# Patient Record
Sex: Male | Born: 1985 | Race: White | Hispanic: No | Marital: Single | Smoking: Never smoker
Health system: Southern US, Community
[De-identification: ages and names within clinical notes are randomized; demographics above are authoritative.]

---

## 2020-08-12 ENCOUNTER — Encounter (HOSPITAL_COMMUNITY)
Admission: EM | Disposition: A | Discharge status: Home / Self Care | Payer: Self-pay | Source: Home / Self Care | Attending: Neurology

## 2020-08-12 ENCOUNTER — Emergency Department (HOSPITAL_COMMUNITY): Payer: Self-pay

## 2020-08-12 ENCOUNTER — Inpatient Hospital Stay (HOSPITAL_COMMUNITY)
Admission: EM | Admit: 2020-08-12 | Discharge: 2020-08-16 | DRG: 062 | Disposition: A | Discharge status: Home / Self Care | Payer: Self-pay | Attending: Neurology | Admitting: Neurology

## 2020-08-12 ENCOUNTER — Encounter (HOSPITAL_COMMUNITY): Payer: Self-pay | Admitting: Emergency Medicine

## 2020-08-12 DIAGNOSIS — E669 Obesity, unspecified: Secondary | ICD-10-CM | POA: Diagnosis present

## 2020-08-12 DIAGNOSIS — Z953 Presence of xenogenic heart valve: Secondary | ICD-10-CM

## 2020-08-12 DIAGNOSIS — I63511 Cerebral infarction due to unspecified occlusion or stenosis of right middle cerebral artery: Secondary | ICD-10-CM | POA: Diagnosis present

## 2020-08-12 DIAGNOSIS — G8194 Hemiplegia, unspecified affecting left nondominant side: Secondary | ICD-10-CM | POA: Diagnosis present

## 2020-08-12 DIAGNOSIS — R29703 NIHSS score 3: Secondary | ICD-10-CM | POA: Diagnosis present

## 2020-08-12 DIAGNOSIS — E785 Hyperlipidemia, unspecified: Secondary | ICD-10-CM | POA: Diagnosis present

## 2020-08-12 DIAGNOSIS — Z8249 Family history of ischemic heart disease and other diseases of the circulatory system: Secondary | ICD-10-CM

## 2020-08-12 DIAGNOSIS — R7401 Elevation of levels of liver transaminase levels: Secondary | ICD-10-CM | POA: Diagnosis present

## 2020-08-12 DIAGNOSIS — I63411 Cerebral infarction due to embolism of right middle cerebral artery: Principal | ICD-10-CM | POA: Diagnosis present

## 2020-08-12 DIAGNOSIS — Z6833 Body mass index (BMI) 33.0-33.9, adult: Secondary | ICD-10-CM

## 2020-08-12 DIAGNOSIS — I639 Cerebral infarction, unspecified: Secondary | ICD-10-CM

## 2020-08-12 DIAGNOSIS — Z880 Allergy status to penicillin: Secondary | ICD-10-CM

## 2020-08-12 DIAGNOSIS — Z20822 Contact with and (suspected) exposure to covid-19: Secondary | ICD-10-CM | POA: Diagnosis present

## 2020-08-12 DIAGNOSIS — Q230 Congenital stenosis of aortic valve: Secondary | ICD-10-CM

## 2020-08-12 DIAGNOSIS — Z833 Family history of diabetes mellitus: Secondary | ICD-10-CM

## 2020-08-12 DIAGNOSIS — R29810 Facial weakness: Secondary | ICD-10-CM | POA: Diagnosis present

## 2020-08-12 DIAGNOSIS — R4781 Slurred speech: Secondary | ICD-10-CM | POA: Diagnosis present

## 2020-08-12 DIAGNOSIS — R7989 Other specified abnormal findings of blood chemistry: Secondary | ICD-10-CM | POA: Diagnosis present

## 2020-08-12 DIAGNOSIS — E876 Hypokalemia: Secondary | ICD-10-CM | POA: Diagnosis present

## 2020-08-12 DIAGNOSIS — Z7982 Long term (current) use of aspirin: Secondary | ICD-10-CM

## 2020-08-12 LAB — PROTIME-INR
INR: 1.1 (ref 0.8–1.2)
Prothrombin Time: 13.8 seconds (ref 11.4–15.2)

## 2020-08-12 LAB — CBC
HCT: 42.5 % (ref 39.0–52.0)
Hemoglobin: 14.9 g/dL (ref 13.0–17.0)
MCH: 31 pg (ref 26.0–34.0)
MCHC: 35.1 g/dL (ref 30.0–36.0)
MCV: 88.5 fL (ref 80.0–100.0)
Platelets: 167 10*3/uL (ref 150–400)
RBC: 4.8 MIL/uL (ref 4.22–5.81)
RDW: 11.9 % (ref 11.5–15.5)
WBC: 5.9 10*3/uL (ref 4.0–10.5)
nRBC: 0 % (ref 0.0–0.2)

## 2020-08-12 LAB — DIFFERENTIAL
Abs Immature Granulocytes: 0.01 10*3/uL (ref 0.00–0.07)
Basophils Absolute: 0 10*3/uL (ref 0.0–0.1)
Basophils Relative: 1 %
Eosinophils Absolute: 0 10*3/uL (ref 0.0–0.5)
Eosinophils Relative: 0 %
Immature Granulocytes: 0 %
Lymphocytes Relative: 24 %
Lymphs Abs: 1.4 10*3/uL (ref 0.7–4.0)
Monocytes Absolute: 0.4 10*3/uL (ref 0.1–1.0)
Monocytes Relative: 7 %
Neutro Abs: 4 10*3/uL (ref 1.7–7.7)
Neutrophils Relative %: 68 %

## 2020-08-12 LAB — COMPREHENSIVE METABOLIC PANEL
ALT: 65 U/L — ABNORMAL HIGH (ref 0–44)
AST: 31 U/L (ref 15–41)
Albumin: 3.9 g/dL (ref 3.5–5.0)
Alkaline Phosphatase: 43 U/L (ref 38–126)
Anion gap: 9 (ref 5–15)
BUN: 12 mg/dL (ref 6–20)
CO2: 26 mmol/L (ref 22–32)
Calcium: 8.7 mg/dL — ABNORMAL LOW (ref 8.9–10.3)
Chloride: 103 mmol/L (ref 98–111)
Creatinine, Ser: 1.17 mg/dL (ref 0.61–1.24)
GFR, Estimated: >60 mL/min (ref >60)
Glucose, Bld: 84 mg/dL (ref 70–99)
Potassium: 3.4 mmol/L — ABNORMAL LOW (ref 3.5–5.1)
Sodium: 138 mmol/L (ref 135–145)
Total Bilirubin: 1 mg/dL (ref 0.3–1.2)
Total Protein: 6.4 g/dL — ABNORMAL LOW (ref 6.5–8.1)

## 2020-08-12 LAB — I-STAT CHEM 8, ED
BUN: 11 mg/dL (ref 6–20)
Calcium, Ion: 1.23 mmol/L (ref 1.15–1.40)
Chloride: 102 mmol/L (ref 98–111)
Creatinine, Ser: 1.1 mg/dL (ref 0.61–1.24)
Glucose, Bld: 77 mg/dL (ref 70–99)
HCT: 41 % (ref 39.0–52.0)
Hemoglobin: 13.9 g/dL (ref 13.0–17.0)
Potassium: 3.5 mmol/L (ref 3.5–5.1)
Sodium: 141 mmol/L (ref 135–145)
TCO2: 26 mmol/L (ref 22–32)

## 2020-08-12 LAB — APTT: aPTT: 29 seconds (ref 24–36)

## 2020-08-12 LAB — RESP PANEL BY RT-PCR (FLU A&B, COVID) ARPGX2
Influenza A by PCR: NEGATIVE
Influenza B by PCR: NEGATIVE
SARS Coronavirus 2 by RT PCR: NEGATIVE

## 2020-08-12 LAB — CBG MONITORING, ED: Glucose-Capillary: 87 mg/dL (ref 70–99)

## 2020-08-12 LAB — ETHANOL: Alcohol, Ethyl (B): <10 mg/dL (ref <10)

## 2020-08-12 SURGERY — IR WITH ANESTHESIA
Anesthesia: General

## 2020-08-12 MED ORDER — SODIUM CHLORIDE 0.9 % IV SOLN: 50.0000 mL | Freq: Once | INTRAVENOUS | Status: AC

## 2020-08-12 MED ORDER — IOHEXOL 350 MG/ML SOLN
75.0000 mL | Freq: Once | INTRAVENOUS | Status: AC | PRN
  Administered 2020-08-12: 75 mL via INTRAVENOUS

## 2020-08-12 MED ORDER — ALTEPLASE (STROKE) FULL DOSE INFUSION
90.0000 mg | Freq: Once | INTRAVENOUS | Status: DC
  Filled 2020-08-12: qty 100

## 2020-08-12 MED ORDER — ALTEPLASE (STROKE) FULL DOSE INFUSION
90.0000 mg | Freq: Once | INTRAVENOUS | Status: AC
  Administered 2020-08-12: 90 mg via INTRAVENOUS
  Filled 2020-08-12: qty 90

## 2020-08-12 MED ORDER — IOHEXOL 350 MG/ML SOLN
50.0000 mL | Freq: Once | INTRAVENOUS | Status: AC | PRN
  Administered 2020-08-12: 50 mL via INTRAVENOUS

## 2020-08-12 MED ORDER — SODIUM CHLORIDE 0.9 % IV SOLN: 50.0000 mL | Freq: Once | INTRAVENOUS | Status: DC

## 2020-08-12 NOTE — Consult Note (Signed)
[facility] TeleNeurology Consult Services   Date of Service:   08/12/2020 20:12:23  Diagnosis:     .  I63.00 - Cerebrovascular accident (CVA) due to thrombosis of precerebral artery (HCCC)  Impression: 34 yr old man, hx of AVR in 2015, not on any meds, with sudden onset L sided face, arm, leg numbness, slurred speech- NIH 2.  Speech improved. Numbness is complete, he cannot feel L face, arm, leg.   CT head no acute changes.   Diff Dx: Acute CVA, other, he is candidate for IV thrombolytics, risks, benefits d/w him, he has no absolute contraindications. Additional testing came back as I was discussing alteplase, and CTA shows R M2 proximal occlusion, acute, per radiology, and patient agrees to proceed with alteplase, and I also d/w ER Dr Pilar Plate. Pt to be transferred to [facility] after starting alteplase infusion, per discussion between Dr Pilar Plate and Pat[facility] Neurology/ NIR.   Rec:   - Weight based alteplase administered, bolus in no complications, infusion going  - ICU care, and post alteplase protocol  - NIR evaluation for R M2 occlusion, patient is being transferred to [facility].  - Possible thrombectomy candidate post CT perfusion which is planned to be done at [facility] per ER .  - MRI Brain  - CVA work up  - cardiac work up, r/o embolic source.  - Neurology follow up   d/w ER all of the above recs.  Metrics: Last Known Well: 08/12/2020 19:15:00 [facility] Notification Time: 08/12/2020 66:29:47 Arrival Time: 08/12/2020 19:44:00 Stamp Time: 08/12/2020 65:46:50 Time First Login Attempt: 08/12/2020 20:18:29 Symptoms: L numbness, weakness, slurred speech. NIHSS Start Assessment Time: 08/12/2020 20:23:30 Thrombolytic Early Mix Decision Time: 08/12/2020 20:30:48 Patient is a candidate for Thrombolytic. Thrombolytic Medical Decision: 08/12/2020 20:34:15 Needle Time: 08/12/2020 20:59:41 Weight Noted by Staff: 241.5 lbs Reason for Thrombolytic Delay: Delayed  hospital activation of TS Service, Delays related to prolonged mixing and delivery of Thrombolytics ,  CT head showed no acute hemorrhage or acute core infarct.  ED Physician notified of diagnostic impression and management plan on 08/12/2020 20:55:27  Advanced Imaging: CTA Head and Neck Completed.  LVO:Yes  Discussed with NIR:No  Discussed with NIR Text:  ER Dr Pilar Plate d/w [facility] and patient to be transferred to [facility] Neurology / NIR. This is per protocol, and I am available if needed. D/w DR Pilar Plate ER.   Thrombolytic Contraindications:  Last Known Well > 4.5 hours: No CT Head showing hemorrhage: No Ischemic stroke within 3 months: No Severe head trauma within 3 months: No Intracranial/intraspinal surgery within 3 months: No History of intracranial hemorrhage: No Symptoms and signs consistent with an SAH: No GI malignancy or GI bleed within 21 days: No Coagulopathy: Platelets <100 000/mm3, INR >1.7, aPTT>40 s, or PT >15 s: No Treatment dose of LMWH within the previous 24 hrs: No Use of NOACs in past 48 hours: No Glycoprotein IIb/IIIa receptor inhibitors use: No Symptoms consistent with infective endocarditis: No Suspected aortic arch dissection: No Intra-axial intracranial neoplasm: No  Thrombolytic Decision and Management Plan: Management with thrombolytic treatment was explained to the Patient as was risks and benefits and alternatives to the treatment. Patient agrees with the decision to proceed with thrombolytic treatment. . All questions were answered and the Patient expressed understanding of the treatment plan.  Our recommendations are outlined below.  Recommendations: IV Alteplase recommended.  Thrombolytic bolus given Without Complication.   IV Alteplase/Activase Total Dose - 90.0 mg IV Alteplase/Activase Bolus Dose -  9.0 mg IV Alteplase/Activase Infusion Dose - 81.0 mg   Routine post Thrombolytic monitoring including neuro checks and blood  pressure control during/after treatment Monitor blood pressure Check blood pressure and neuro assessment every 15 min for 2 h, then every 30 min for 6 h, and finally every hour for 16 h.  Manage Blood Pressure per post Thrombolytic protocol.      .  Admission to ICU     .  CT brain 24 hours post Thrombolytic     .  NPO until swallowing screen performed and passed     .  No antiplatelet agents or anticoagulants (including heparin for DVT prophylaxis) in first 24 hours     .  No Foley catheter, nasogastric tube, arterial catheter or central venous catheter for 24 hr, unless absolutely necessary     .  Telemetry     .  Bedside swallow evaluation     .  HOB less than 30 degrees     .  Euglycemia     .  Avoid hyperthermia, PRN acetaminophen     .  DVT prophylaxis     .  Inpatient Neurology Consultation     .  Stroke evaluation as per inpatient neurology recommendations  Discussed with ED physician    ------------------------------------------------------------------------------  History of Present Illness: Patient is a 34 year old Male.  Patient was brought by private transportation with symptoms of L numbness, weakness, slurred speech.  34 yr old man, hx of Aortic valve replacement, not on any meds at home, reports at 1915 today, he had sudden onset L arm, leg numbness, weakness L hand, he dropped his phone and could not grip it, he came to ER. He has no weakness, he has persistent significant numbness L face, arm, leg, he initially had aphasia/dysarthria, now improved. He denied any head trauma, no CI for IV thrombolytics which was discussed.  Last seen normal was within 4.5 hours. There is no history of hemorrhagic complications or intracranial hemorrhage. There is no history of Recent Anticoagulants. There is no history of recent major surgery. There is no history of recent stroke.  Past Medical History:     . Aortic valve replacement  Social History: Smoking: No Alcohol Use:  No Drug Use: No  Family History:cad  Review of System:  14 Points Review of Systems was performed and was negative except mentioned in HPI.  Anticoagulant use:  No  Antiplatelet use: No  Allergies:  Reviewed   Examination: BP(143/93, 129/80), Pulse(88, 79), Blood Glucose(77) 1A: Level of Consciousness - Alert; keenly responsive + 0 1B: Ask Month and Age - Both Questions Right + 0 1C: Blink Eyes & Squeeze Hands - Performs Both Tasks + 0 2: Test Horizontal Extraocular Movements - Normal + 0 3: Test Visual Fields - No Visual Loss + 0 4: Test Facial Palsy (Use Grimace if Obtunded) - Normal symmetry + 0 5A: Test Left Arm Motor Drift - No Drift for 10 Seconds + 0 5B: Test Right Arm Motor Drift - No Drift for 10 Seconds + 0 6A: Test Left Leg Motor Drift - No Drift for 5 Seconds + 0 6B: Test Right Leg Motor Drift - No Drift for 5 Seconds + 0 7: Test Limb Ataxia (FNF/Heel-Shin) - No Ataxia + 0 8: Test Sensation - Complete Loss: Cannot Sense Being Touched At All + 2 9: Test Language/Aphasia - Normal; No aphasia + 0 10: Test Dysarthria - Normal + 0 11: Test Extinction/Inattention - No  abnormality + 0  NIHSS Score: 2  Pre-Morbid Modified Rankin Scale: 0 Points = No symptoms at all   Patient/Family was informed the Neurology Consult would occur via TeleHealth consult by way of interactive audio and video telecommunications and consented to receiving care in this manner.   Patient is being evaluated for possible acute neurologic impairment and high probability of imminent or life-threatening deterioration. I spent total of 45 minutes providing care to this patient, including time for face to face visit via telemedicine, review of medical records, imaging studies and discussion of findings with providers, the patient and/or family.   Dr Rubye Oaks   [facility] [phone]  Case [id number]

## 2020-08-12 NOTE — ED Provider Notes (Signed)
[facility]-EMERGENCY DEPT [facility] Emergency Department Provider Note MRN:  [id number]  Arrival date & time: 08/12/20     Chief Complaint   Code Stroke   History of Present Illness   Anthony Buckley is a 34 y.o. year-old male with no pertinent past medical history presenting to the ED with chief complaint of code stroke.  Patient experienced sudden onset left-sided numbness at 7:15 PM, 30 minutes prior to arrival in the ED.  Denies any weakness, no speech disturbance, no visual disturbance, this is never happened before.  No chest pain or shortness of breath, no abdominal pain, no neck pain, no other complaints.  Symptoms constant since onset.  Review of Systems  A complete 10 system review of systems was obtained and all systems are negative except as noted in the HPI and PMH.   Patient's Health History   History reviewed. No pertinent past medical history.  History reviewed. No pertinent surgical history.  No family history on file.  Social History   Socioeconomic History  . Marital status: Single    Spouse name: Not on file  . Number of children: Not on file  . Years of education: Not on file  . Highest education level: Not on file  Occupational History  . Not on file  Tobacco Use  . Smoking status: Not on file  Substance and Sexual Activity  . Alcohol use: Not on file  . Drug use: Not on file  . Sexual activity: Not on file  Other Topics Concern  . Not on file  Social History Narrative  . Not on file   Social Determinants of Health   Financial Resource Strain:   . Difficulty of Paying Living Expenses: Not on file  Food Insecurity:   . Worried About Programme researcher, broadcasting/film/video in the Last Year: Not on file  . Ran Out of Food in the Last Year: Not on file  Transportation Needs:   . Lack of Transportation (Medical): Not on file  . Lack of Transportation (Non-Medical): Not on file  Physical Activity:   . Days of Exercise per Week: Not on file  . Minutes of Exercise per  Session: Not on file  Stress:   . Feeling of Stress : Not on file  Social Connections:   . Frequency of Communication with Friends and Family: Not on file  . Frequency of Social Gatherings with Friends and Family: Not on file  . Attends Religious Services: Not on file  . Active Member of Clubs or Organizations: Not on file  . Attends Banker Meetings: Not on file  . Marital Status: Not on file  Intimate Partner Violence:   . Fear of Current or Ex-Partner: Not on file  . Emotionally Abused: Not on file  . Physically Abused: Not on file  . Sexually Abused: Not on file     Physical Exam   Vitals:   08/12/20 1957 08/12/20 2030  BP: (!) 162/116 137/89  Pulse: 92 89  Resp: 16 10  Temp: 98.2 F (36.8 C)   SpO2: 100% 100%    CONSTITUTIONAL: Well-appearing, NAD NEURO:  Alert and oriented x 3, normal and symmetric strength, decreased sensation to the left face, left arm, left leg EYES:  eyes equal and reactive ENT/NECK:  no LAD, no JVD CARDIO: Regular rate, well-perfused, normal S1 and S2 PULM:  CTAB no wheezing or rhonchi GI/GU:  normal bowel sounds, non-distended, non-tender MSK/SPINE:  No gross deformities, no edema SKIN:  no rash, atraumatic  PSYCH:  Appropriate speech and behavior  *Additional and/or pertinent findings included in MDM below  Diagnostic and Interventional Summary    EKG Interpretation  Date/Time:  Thursday August 12 2020 20:28:32 EST Ventricular Rate:  89 PR Interval:    QRS Duration: 95 QT Interval:  373 QTC Calculation: 454 R Axis:   87 Text Interpretation: Sinus rhythm Minimal ST depression, inferior leads Confirmed by Kennis Carina [phone]) on 08/12/2020 8:47:00 PM      Labs Reviewed  CBC  DIFFERENTIAL  ETHANOL  PROTIME-INR  APTT  COMPREHENSIVE METABOLIC PANEL  RAPID URINE DRUG SCREEN, HOSP PERFORMED  URINALYSIS, ROUTINE W REFLEX MICROSCOPIC  I-STAT CHEM 8, ED    CT Angio Head W or Wo Contrast  Final Result    CT Angio  Neck W and/or Wo Contrast  Final Result    CT HEAD CODE STROKE WO CONTRAST  Final Result      Medications  alteplase (ACTIVASE) 1 mg/mL infusion 90 mg (has no administration in time range)    Followed by  0.9 %  sodium chloride infusion (has no administration in time range)  iohexol (OMNIPAQUE) 350 MG/ML injection 75 mL (75 mLs Intravenous Contrast Given 08/12/20 2016)     Procedures  /  Critical Care .Critical Care Performed by: Sabas Sous, MD Authorized by: Sabas Sous, MD   Critical care provider statement:    Critical care time (minutes):  40   Critical care was necessary to treat or prevent imminent or life-threatening deterioration of the following conditions: Acute ischemic stroke.   Critical care was time spent personally by me on the following activities:  Discussions with consultants, evaluation of patient's response to treatment, examination of patient, ordering and performing treatments and interventions, ordering and review of laboratory studies, ordering and review of radiographic studies, pulse oximetry, re-evaluation of patient's condition, obtaining history from patient or surrogate and review of old charts    ED Course and Medical Decision Making  I have reviewed the triage vital signs, the nursing notes, and pertinent available records from the EMR.  Listed above are laboratory and imaging tests that I personally ordered, reviewed, and interpreted and then considered in my medical decision making (see below for details).  Pure left-sided sensory losses onset 45 minutes ago and is otherwise healthy 34 year old male.  Code stroke initiated, awaiting imaging and telemetry neurology recommendations.     CTA reveals acute right M2 occlusion, patient being evaluated by Dr. Nelson Chimes of teleneurology.  Patient is a TPA candidate and the risks and benefits were explained and patient agrees to TPA.  Patient is also potentially an IR candidate.  I spoke with Dr. Iver Nestle  at [facility] who is now aware of the patient and will inform the IR team.  Dr. Rubin Payor is the accepting ED physician for transfer.  Patient to receive TPA and then be transported.  Elmer Sow. Pilar Plate, MD [facility] Emergency Medicine [facility] [email or web address]  Final Clinical Impressions(s) / ED Diagnoses     ICD-10-CM   1. Acute ischemic stroke [facility])  I63.9     ED Discharge Orders    None       Discharge Instructions Discussed with and Provided to Patient:   Discharge Instructions   None       Sabas Sous, MD 08/12/20 2051

## 2020-08-12 NOTE — ED Triage Notes (Addendum)
Pt c/o loss of sensation in left side of body and slurred speach that began an hour ago. States he dropped his phone and went to pick it up and couldn't grip his phone with this left hand. Hand grips present and strong bilaterally. Last known normal 1915 today. Pt assessed by MD Bero in triage.

## 2020-08-12 NOTE — Progress Notes (Signed)
PHARMACIST CODE STROKE RESPONSE  Notified to mix tPA at 20:38 by Dr. Pilar Plate Delivered tPA to RN at 20:48  tPA dose = 9 mg bolus over 1 minute followed by 81 mg for a total dose of 90 mg over 1 hour  Issues/delays encountered (if applicable):   Loralee Pacas, PharmD, BCPS Pharmacy: [phone] 08/12/20 9:11 PM

## 2020-08-12 NOTE — Code Documentation (Signed)
Stroke Response Nurse Documentation Code Documentation  Anthony Buckley is a 34 y.o. male arriving to [facility] ED via [facility] on 12/2 with past medical hx of AVR x2. Code stroke was activated by [facility] ED. Patient from home where he was LKW at [phone] and now complaining of loss of sensation LUE . On No antithrombotic. Stroke team at the bedside on patient arrival. Labs drawn and patient cleared for CT by Dr. Rubin Payor. Patient to CT with team. NIHSS 3, see documentation for details and code stroke times. Patient with left facial droop and left decreased sensation on exam. The following imaging was completed:  CTA head and neck, CTP. Patient is a candidate for tPA and initiated at [facility] ED PTA. Right MCA occlusion, Dr. Corliss Skains and Dr. Iver Nestle discussed with pt and pt wishes for more time to decide.  Bedside handoff with ED RN Eulis Foster.    Rose Fillers  Rapid Response RN

## 2020-08-12 NOTE — ED Notes (Signed)
TPA started at 2059.

## 2020-08-12 NOTE — ED Provider Notes (Signed)
  Physical Exam  BP 129/80   Pulse 82   Temp 98.2 F (36.8 C) (Oral)   Resp 12   Ht 5\' 11"  (1.803 m)   Wt 109.5 kg   SpO2 98%   BMI 33.68 kg/m   Physical Exam  ED Course/Procedures     Procedures  MDM  Transfer from [facility].  Acute stroke.  To get CT perfusion and admit to ICU by neurology.  Patient states so far no improvement with the TPA.       [facility], MD 08/12/20 2204

## 2020-08-12 NOTE — H&P (Addendum)
Neurology H&P  CC: Left-sided numbness and left facial droop, transient left hand weakness  History is obtained from: Patient and brother at bedside  HPI: Anthony Buckley is a 34 y.o. male with a PMHx of congenital aortic stenosis and regurgitation status post aortic valve replacement x2 (at age 7 and then again in 7169) complicated by aortic dilation status post graft (2015 with valve replacement). He reports he was recommended to take aspirin for 6 weeks after his last surgery in 2015. Otherwise he has not been on any medications. He is to follow with Dr. Festus Holts at [facility] clinic (last seen in 2016 due to lapse in insurance).  He was in his usual state of health today when at 7:15 PM he suddenly had difficulty with his left side, dropping his phone and feeling numb. He presented to [facility] long hospital and was evaluated by teleneurology  On review of systems, he denies any other transient neurological symptoms but then his brother reports that he did have 1 transient episode of left hand numbness a few weeks ago lasting a few seconds (chalked up to being outside in the cold, under dressed for the weather). He additionally reports having lost 30 pounds since June 1, most of this over the early summer months and stable for the past 3 months, intentionally lost with lifestyle changes (diet and exercise).  He additionally notes that he has had some difficult experiences with anesthesia (unstable heart rates for his first 2 surgeries, uncomplicated most recent procedure)  LKW: 7:15 PM on 08/14/19/2021 tPA given?: Yes, started at [facility] long hospital at 20:59 IA performed?: No,  Premorbid modified rankin scale:      0 - No symptoms.  ROS: A 14 point ROS was performed and is negative except as noted in the HPI.    Pertinent medical history and surgical history as noted above  Family history: Maternal grandfather with cancer in his 34s Paternal aunt and uncle with diabetes Concern for  alcohol abuse on both sides of the family  Social History: He reports he is currently unemployed. 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 He reports he has never smoked or used any illegal substances. He drinks twice a month ~2 beers   Exam: Current vital signs: BP (!) 152/89   Pulse 84   Temp 98.2 F (36.8 C) (Oral)   Resp 14   Ht 5' 11" (1.803 m)   Wt 109.5 kg   SpO2 100%   BMI 33.68 kg/m  Vital signs in last 24 hours: Temp:  [98.2 F (36.8 C)] 98.2 F (36.8 C) (12/02 1957) Pulse Rate:  [82-92] 84 (12/02 2200) Resp:  [10-16] 14 (12/02 2200) BP: (129-162)/(80-116) 152/89 (12/02 2200) SpO2:  [97 %-100 %] 100 % (12/02 2200) Weight:  [108.9 kg-109.5 kg] 109.5 kg (12/02 2033)   Physical Exam  Constitutional: Appears well-developed and well-nourished.  Psych: Affect appropriate to situation Eyes: No scleral injection HENT: No OP obstruction, good dentition MSK: no joint deformities.  Cardiovascular: Perfusing extremities well, regular rate Respiratory: Effort normal, non-labored breathing GI: Soft.  No distension. There is no tenderness.  Skin: WDI, well-healed sternotomy scar  Neuro: Mental Status: Patient is awake, alert, oriented to person, place, month, year, and situation. Patient is able to give a clear and coherent history. No signs of aphasia or neglect Cranial Nerves: II: Visual Fields are full. Pupils are equal, round, and reactive to light.   III,IV, VI: EOMI without ptosis or  diploplia.  V: Facial sensation is notable for reduced sensation on the left side of the face VII: Facial movement is notable for mild facial droop, improved after TPA administration VIII: hearing is intact to voice X: Uvula elevates symmetrically XI: Shoulder shrug is symmetric. XII: tongue is midline without atrophy or fasciculations.  Motor: Tone is normal. Bulk is normal. 5/5 strength was present in all four extremities.  Sensory: Initially  had severe sensory loss on the left side, after TPA this was improving starting at about 11:30 PM Deep Tendon Reflexes: 2+ and symmetric in the biceps and patellae, brisk Cerebellar: FNF and HKS are intact bilaterally  NIHSS total 3 Score breakdown: 1 for facial droop, 2 for sensory loss  At 10:30 PM NIH stroke scale remained a 3, identical to prior  By 1 AM, NIH stroke scale had improved to 2 (improving but still subtle left facial droop, improving ability to feel on the left side)  I have reviewed labs in epic and the results pertinent to this consultation are: COVID-19 negative,  Creatinine 1.17, GFR greater than 60 CMP with mildly elevated ALT (65) Normal CBC  I have reviewed the images obtained: HCT with hyperdense vessel CTA with R M2 occlusion CTP with 60 mL tissue at risk, core corresponding to patients symptoms but possibly ghost core due to less than 6 hours since onset    Impression: This is a 34 year old gentleman with past medical history significant for aortic valve replacement and obesity (BMI 33.68), presenting with a right MCA likely cardioembolic stroke.  There is an extended discussion about intervention risks and benefits given low NIH stroke scale but proximal occlusion with large area at risk.  Ultimately a conservative approach with close observation was selected by the patient.  Given proximal occlusion despite low NIH stroke scale, patient was not felt to be an OPTIMIST trial candidate and every hour neuro checks in the ICU are indicated in case patient declines at which point thrombectomy would be pursued if head CT remains negative for hemorrhage  Recommendations:  # R MCA M2 stroke s/p tPA - Stroke labs TSH, ESR, RPR, HgbA1c, fasting lipid panel - Outpatient hypercoagulable panel given patient received tPA - MRI brain  - MRA of the brain without contrast and MRA neck w/wo or CTA  - Frequent neuro checks (every hour for the next 8 hours) -  Echocardiogram - Prophylactic therapy-Antiplatelet med: Aspirin - dose 34m PO or 3064mPR, followed by 81 mg daily  - Statin if indicated for LDL goal <  70 - Risk factor modification (diet/exercise) - Telemetry monitoring; 30 day event monitor on discharge if no arrythmias captured  - Blood pressure goal   - Post tPA for 24  hours < 180/105 - PT consult, OT consult, Speech consult, unless patient is back to baseline - Please page neurology emergently for any decline in exam, reactivate code stroke for potential IR - Stroke team to follow  SrLesleigh NoeD-PhD [facility] [phone] minutes of critical care time were spent with this patient, including discussions with IR, emergency department, patient and family, over 50% at bedside due to potential life-threatening stroke.

## 2020-08-13 ENCOUNTER — Inpatient Hospital Stay (HOSPITAL_COMMUNITY): Payer: Self-pay

## 2020-08-13 DIAGNOSIS — I63511 Cerebral infarction due to unspecified occlusion or stenosis of right middle cerebral artery: Secondary | ICD-10-CM | POA: Diagnosis present

## 2020-08-13 DIAGNOSIS — I361 Nonrheumatic tricuspid (valve) insufficiency: Secondary | ICD-10-CM

## 2020-08-13 DIAGNOSIS — I639 Cerebral infarction, unspecified: Secondary | ICD-10-CM

## 2020-08-13 DIAGNOSIS — R7989 Other specified abnormal findings of blood chemistry: Secondary | ICD-10-CM

## 2020-08-13 DIAGNOSIS — E7800 Pure hypercholesterolemia, unspecified: Secondary | ICD-10-CM

## 2020-08-13 LAB — LIPID PANEL
Cholesterol: 189 mg/dL (ref 0–200)
HDL: 25 mg/dL — ABNORMAL LOW (ref >40)
LDL Cholesterol: 144 mg/dL — ABNORMAL HIGH (ref 0–99)
Total CHOL/HDL Ratio: 7.6 RATIO
Triglycerides: 101 mg/dL (ref <150)
VLDL: 20 mg/dL (ref 0–40)

## 2020-08-13 LAB — HEMOGLOBIN A1C
Hgb A1c MFr Bld: 4.6 % — ABNORMAL LOW (ref 4.8–5.6)
Mean Plasma Glucose: 85.32 mg/dL

## 2020-08-13 LAB — COMPREHENSIVE METABOLIC PANEL
ALT: 59 U/L — ABNORMAL HIGH (ref 0–44)
AST: 25 U/L (ref 15–41)
Albumin: 3.8 g/dL (ref 3.5–5.0)
Alkaline Phosphatase: 44 U/L (ref 38–126)
Anion gap: 11 (ref 5–15)
BUN: 12 mg/dL (ref 6–20)
CO2: 24 mmol/L (ref 22–32)
Calcium: 9.2 mg/dL (ref 8.9–10.3)
Chloride: 105 mmol/L (ref 98–111)
Creatinine, Ser: 1.06 mg/dL (ref 0.61–1.24)
GFR, Estimated: >60 mL/min (ref >60)
Glucose, Bld: 87 mg/dL (ref 70–99)
Potassium: 3.3 mmol/L — ABNORMAL LOW (ref 3.5–5.1)
Sodium: 140 mmol/L (ref 135–145)
Total Bilirubin: 0.8 mg/dL (ref 0.3–1.2)
Total Protein: 6.1 g/dL — ABNORMAL LOW (ref 6.5–8.1)

## 2020-08-13 LAB — ECHOCARDIOGRAM COMPLETE
AR max vel: 2.14 cm2
AV Area VTI: 2.23 cm2
AV Area mean vel: 2.04 cm2
AV Mean grad: 11.3 mmHg
AV Peak grad: 20.7 mmHg
Ao pk vel: 2.27 m/s
Area-P 1/2: 3.17 cm2
Height: 71 in
P 1/2 time: 639 msec
S' Lateral: 3.1 cm
Weight: 3864 oz

## 2020-08-13 LAB — CBC WITH DIFFERENTIAL/PLATELET
Abs Immature Granulocytes: 0.02 10*3/uL (ref 0.00–0.07)
Basophils Absolute: 0 10*3/uL (ref 0.0–0.1)
Basophils Relative: 0 %
Eosinophils Absolute: 0 10*3/uL (ref 0.0–0.5)
Eosinophils Relative: 1 %
HCT: 41.2 % (ref 39.0–52.0)
Hemoglobin: 14.9 g/dL (ref 13.0–17.0)
Immature Granulocytes: 0 %
Lymphocytes Relative: 33 %
Lymphs Abs: 2.3 10*3/uL (ref 0.7–4.0)
MCH: 31 pg (ref 26.0–34.0)
MCHC: 36.2 g/dL — ABNORMAL HIGH (ref 30.0–36.0)
MCV: 85.8 fL (ref 80.0–100.0)
Monocytes Absolute: 0.6 10*3/uL (ref 0.1–1.0)
Monocytes Relative: 9 %
Neutro Abs: 4 10*3/uL (ref 1.7–7.7)
Neutrophils Relative %: 57 %
Platelets: 169 10*3/uL (ref 150–400)
RBC: 4.8 MIL/uL (ref 4.22–5.81)
RDW: 11.9 % (ref 11.5–15.5)
WBC: 7 10*3/uL (ref 4.0–10.5)
nRBC: 0 % (ref 0.0–0.2)

## 2020-08-13 LAB — RAPID URINE DRUG SCREEN, HOSP PERFORMED
Amphetamines: NOT DETECTED
Barbiturates: NOT DETECTED
Benzodiazepines: NOT DETECTED
Cocaine: NOT DETECTED
Opiates: NOT DETECTED
Tetrahydrocannabinol: NOT DETECTED

## 2020-08-13 LAB — MRSA PCR SCREENING: MRSA by PCR: NEGATIVE

## 2020-08-13 LAB — C-REACTIVE PROTEIN: CRP: 0.7 mg/dL (ref <1.0)

## 2020-08-13 LAB — RPR: RPR Ser Ql: NONREACTIVE

## 2020-08-13 LAB — HIV ANTIBODY (ROUTINE TESTING W REFLEX): HIV Screen 4th Generation wRfx: NONREACTIVE

## 2020-08-13 LAB — TSH: TSH: 9.549 u[IU]/mL — ABNORMAL HIGH (ref 0.350–4.500)

## 2020-08-13 LAB — SEDIMENTATION RATE: Sed Rate: 1 mm/hr (ref 0–16)

## 2020-08-13 MED ORDER — ATORVASTATIN CALCIUM 40 MG PO TABS
40.0000 mg | ORAL_TABLET | Freq: Every day | ORAL | Status: DC
  Administered 2020-08-13 – 2020-08-16 (×4): 40 mg via ORAL
  Filled 2020-08-13 (×4): qty 1

## 2020-08-13 MED ORDER — ACETAMINOPHEN 325 MG PO TABS: 650.0000 mg | ORAL_TABLET | ORAL | Status: DC | PRN

## 2020-08-13 MED ORDER — SODIUM CHLORIDE 0.9 % IV SOLN: INTRAVENOUS | Status: DC

## 2020-08-13 MED ORDER — ASPIRIN EC 325 MG PO TBEC
325.0000 mg | DELAYED_RELEASE_TABLET | Freq: Every day | ORAL | Status: DC
  Administered 2020-08-13: 325 mg via ORAL
  Filled 2020-08-13: qty 1

## 2020-08-13 MED ORDER — CHLORHEXIDINE GLUCONATE CLOTH 2 % EX PADS
6.0000 | MEDICATED_PAD | Freq: Every day | CUTANEOUS | Status: DC
  Administered 2020-08-14: 6 via TOPICAL

## 2020-08-13 MED ORDER — POTASSIUM CHLORIDE CRYS ER 20 MEQ PO TBCR
40.0000 meq | EXTENDED_RELEASE_TABLET | ORAL | Status: AC
  Administered 2020-08-13 (×2): 40 meq via ORAL
  Filled 2020-08-13 (×2): qty 2

## 2020-08-13 MED ORDER — SENNOSIDES-DOCUSATE SODIUM 8.6-50 MG PO TABS: 1.0000 | ORAL_TABLET | Freq: Every evening | ORAL | Status: DC | PRN

## 2020-08-13 MED ORDER — ACETAMINOPHEN 650 MG RE SUPP: 650.0000 mg | RECTAL | Status: DC | PRN

## 2020-08-13 MED ORDER — STROKE: EARLY STAGES OF RECOVERY BOOK
Freq: Once | Status: AC
  Filled 2020-08-13: qty 1

## 2020-08-13 MED ORDER — PANTOPRAZOLE SODIUM 40 MG IV SOLR: 40.0000 mg | Freq: Every day | INTRAVENOUS | Status: DC

## 2020-08-13 MED ORDER — ACETAMINOPHEN 160 MG/5ML PO SOLN: 650.0000 mg | ORAL | Status: DC | PRN

## 2020-08-13 MED ORDER — LABETALOL HCL 5 MG/ML IV SOLN: 5.0000 mg | INTRAVENOUS | Status: DC | PRN

## 2020-08-13 MED ORDER — CLOPIDOGREL BISULFATE 75 MG PO TABS
75.0000 mg | ORAL_TABLET | Freq: Every day | ORAL | Status: DC
  Administered 2020-08-13 – 2020-08-16 (×4): 75 mg via ORAL
  Filled 2020-08-13 (×4): qty 1

## 2020-08-13 MED ORDER — HEPARIN SODIUM (PORCINE) 5000 UNIT/ML IJ SOLN: 5000.0000 [IU] | Freq: Three times a day (TID) | INTRAMUSCULAR | Status: DC

## 2020-08-13 NOTE — Progress Notes (Signed)
  Echocardiogram 2D Echocardiogram has been performed.  Gerda Diss 08/13/2020, 11:42 AM

## 2020-08-13 NOTE — ED Notes (Signed)
Pt arrived to [facility] at 2111 and was taken directly to CT and was brought to assigned room at 2145 were vitals were initially taken.

## 2020-08-13 NOTE — Progress Notes (Signed)
Bilateral lower extremity venous study complete.    Please see CV Proc for preliminary results.   Clint Guy, RVT

## 2020-08-13 NOTE — Evaluation (Signed)
Speech Language Pathology Evaluation Patient Details Name: Anthony Buckley MRN: [id number] DOB: Jan 23, 1986 Today's Date: 08/13/2020 Time: 3154-0086 SLP Time Calculation (min) (ACUTE ONLY): 19 min  Problem List:  Patient Active Problem List   Diagnosis Date Noted  . Acute ischemic right MCA stroke (HCC) 08/13/2020   Past Medical History: History reviewed. No pertinent past medical history. Past Surgical History: History reviewed. No pertinent surgical history. HPI:  Pt is a 34 yo male presenting with L sided numbness, found to have an acute R M2 occlusion s/p tPA. PMH includes congenital aortic stenosis and regurgitation s/p AVR x2    Assessment / Plan / Recommendation Clinical Impression  Pt appears to be at his cognitive-linguistic baseline, scoring 30/30 on the SLUMS. He has been reading with no apparent difficulties. Although he describes possible mild sensory changes on the L side of this face (CN V, primarily V2), his speech is clear and fluent. Of note, a PO diet has not been started yet but pt has passed his Yale swallow screen and per RN, plans are to start a diet once he it is confirmed that he will not need any procedures. No acute SLP needs have been identified. SLP to sign off.      SLP Assessment  SLP Recommendation/Assessment: Patient does not need any further Speech Lanaguage Pathology Services SLP Visit Diagnosis: Cognitive communication deficit (R41.841)    Follow Up Recommendations  None    Frequency and Duration           SLP Evaluation Cognition  Overall Cognitive Status: Within Functional Limits for tasks assessed Orientation Level: Oriented X4       Comprehension  Auditory Comprehension Overall Auditory Comprehension: Appears within functional limits for tasks assessed    Expression Expression Primary Mode of Expression: Verbal Verbal Expression Overall Verbal Expression: Appears within functional limits for tasks assessed   Oral / Motor  Oral  Motor/Sensory Function Overall Oral Motor/Sensory Function: Mild impairment Facial Strength: Reduced left;Suspected CN VII (facial) dysfunction Motor Speech Overall Motor Speech: Appears within functional limits for tasks assessed   GO                    Mahala Menghini., M.A. CCC-SLP Acute Rehabilitation Services Pager [phone] Office [phone]  08/13/2020, 9:45 AM

## 2020-08-13 NOTE — Progress Notes (Signed)
OT Cancellation Note  Patient Details Name: Newton Frutiger MRN: [id number] DOB: Jan 18, 1986   Cancelled Treatment:    Reason Eval/Treat Not Completed: Medical issues which prohibited therapy (bedrest) OT order received and appreciated however this conflicts with current bedrest order set. Please increase activity tolerance as appropriate and remove bedrest from orders. . Please contact OT at [phone] if bed rest order is discontinued. OT will hold evaluation at this time and will check back as time allows pending increased activity orders.   Wynona Neat, OTR/L  Acute Rehabilitation Services Pager: [phone] Office: [phone] .  08/13/2020, 6:59 AM

## 2020-08-13 NOTE — Evaluation (Signed)
Occupational Therapy Evaluation Patient Details Name: Anthony Buckley MRN: [id number] DOB: [birth year] Today's Date: 08/13/2020    History of Present Illness 34 yo male admitted with L side weaness and numbness. R MCA M2 storke s/p TPA PMH congential aortic stenosis and regurgitation s/p aortic valve replacement x2 ( age 38 then again 2015) complicated by aortic dilation s/p graft (2015)    Clinical Impression   Patient evaluated by Occupational Therapy with no further acute OT needs identified. All education has been completed and the patient has no further questions. See below for any follow-up Occupational Therapy or equipment needs. OT to sign off. Thank you for referral.      Follow Up Recommendations  No OT follow up    Equipment Recommendations  None recommended by OT    Recommendations for Other Services       Precautions / Restrictions Precautions Precautions: None      Mobility Bed Mobility Overal bed mobility: Independent                  Transfers Overall transfer level: Independent                    Balance Overall balance assessment: Independent                                         ADL either performed or assessed with clinical judgement   ADL Overall ADL's : Independent                                             Vision Baseline Vision/History: No visual deficits       Perception     Praxis      Pertinent Vitals/Pain Pain Assessment: No/denies pain     Hand Dominance Right   Extremity/Trunk Assessment Upper Extremity Assessment Upper Extremity Assessment: LUE deficits/detail LUE Deficits / Details: reports sensation is not exactly 100% but reports probably 90% normal. the difference is very minimal per his report   Lower Extremity Assessment Lower Extremity Assessment: Defer to PT evaluation   Cervical / Trunk Assessment Cervical / Trunk Assessment: Normal   Communication  Communication Communication: No difficulties   Cognition Arousal/Alertness: Awake/alert Behavior During Therapy: WFL for tasks assessed/performed Overall Cognitive Status: Within Functional Limits for tasks assessed                                     General Comments       Exercises     Shoulder Instructions      Home Living Family/patient expects to be discharged to:: Private residence Living Arrangements: Other relatives (brother and sister in law) Available Help at Discharge: Available PRN/intermittently (sister in law in [facility], brother works from home) Type of Home: House Home Access: Stairs to enter (ramped option)     Home Layout: Two level Alternate Level Stairs-Number of Steps: flight Alternate Level Stairs-Rails: Can reach both Bathroom Shower/Tub: Chief Strategy Officer: Standard     Home Equipment: Environmental consultant - 2 wheels;Tub bench   Additional Comments: downstairs with tub shower and bench      Prior Functioning/Environment Level of Independence: Independent  OT Problem List:        OT Treatment/Interventions:      OT Goals(Current goals can be found in the care plan section) Acute Rehab OT Goals Patient Stated Goal: none stated  OT Frequency:     Barriers to D/C:            Co-evaluation              AM-PAC OT "6 Clicks" Daily Activity     Outcome Measure Help from another person eating meals?: None Help from another person taking care of personal grooming?: None Help from another person toileting, which includes using toliet, bedpan, or urinal?: None Help from another person bathing (including washing, rinsing, drying)?: None Help from another person to put on and taking off regular upper body clothing?: None Help from another person to put on and taking off regular lower body clothing?: None 6 Click Score: 24   End of Session Nurse Communication: Mobility status  Activity Tolerance: Patient  tolerated treatment well Patient left: Other (comment) (with PT Katie)  OT Visit Diagnosis: Unsteadiness on feet (R26.81)                Time: 5093-2671 OT Time Calculation (min): 13 min Charges:  OT General Charges $OT Visit: 1 Visit OT Evaluation $OT Eval Low Complexity: 1 Low   Brynn, OTR/L  Acute Rehabilitation Services Pager: [phone] Office: [phone] .   Mateo Flow 08/13/2020, 1:43 PM

## 2020-08-13 NOTE — Progress Notes (Signed)
STROKE TEAM PROGRESS NOTE   INTERVAL HISTORY Brother in the room. Pt lying in bed, stated that he is doing well, still has some feeling numbness on the left arm and leg and otherwise, no complains. He passed swallow. on testing, he felt symmetrical on the light touch sensation bilaterally though. Pending MRI and MRA tonight. I encouraged him to stay until Monday for the TEE. We also discussed about loop and he will think about that.    Vitals:   08/13/20 0600 08/13/20 0700 08/13/20 0800 08/13/20 0900  BP: (!) 149/84 (!) 156/71 114/77 113/83  Pulse: 78 62 66 67  Resp: 10 (!) 9 15 (!) 23  Temp:   97.7 F (36.5 C)   TempSrc:   Oral   SpO2: 97% 96% 95% 99%  Weight:      Height:       CBC:  Recent Labs  Lab 08/12/20 2003 08/12/20 2003 08/12/20 2036 08/13/20 0425  WBC 5.9  --   --  7.0  NEUTROABS 4.0  --   --  4.0  HGB 14.9   < > 13.9 14.9  HCT 42.5   < > 41.0 41.2  MCV 88.5  --   --  85.8  PLT 167  --   --  169   < > = values in this interval not displayed.   Basic Metabolic Panel:  Recent Labs  Lab 08/12/20 2003 08/12/20 2003 08/12/20 2036 08/13/20 0425  NA 138   < > 141 140  K 3.4*   < > 3.5 3.3*  CL 103   < > 102 105  CO2 26  --   --  24  GLUCOSE 84   < > 77 87  BUN 12   < > 11 12  CREATININE 1.17   < > 1.10 1.06  CALCIUM 8.7*  --   --  9.2   < > = values in this interval not displayed.   Lipid Panel:  Recent Labs  Lab 08/13/20 0425  CHOL 189  TRIG 101  HDL 25*  CHOLHDL 7.6  VLDL 20  LDLCALC 144*   HgbA1c:  Recent Labs  Lab 08/13/20 0425  HGBA1C 4.6*   Urine Drug Screen: No results for input(s): LABOPIA, COCAINSCRNUR, LABBENZ, AMPHETMU, THCU, LABBARB in the last 168 hours.  Alcohol Level  Recent Labs  Lab 08/12/20 2027  ETH <10    IMAGING past 24 hours CT Angio Head W or Wo Contrast  Result Date: 08/12/2020 CLINICAL DATA:  Left-sided numbness EXAM: CT ANGIOGRAPHY HEAD AND NECK TECHNIQUE: Multidetector CT imaging of the head and neck was  performed using the standard protocol during bolus administration of intravenous contrast. Multiplanar CT image reconstructions and MIPs were obtained to evaluate the vascular anatomy. Carotid stenosis measurements (when applicable) are obtained utilizing NASCET criteria, using the distal internal carotid diameter as the denominator. CONTRAST:  38m OMNIPAQUE IOHEXOL 350 MG/ML SOLN COMPARISON:  None. FINDINGS: CTA NECK FINDINGS SKELETON: There is no bony spinal canal stenosis. No lytic or blastic lesion. OTHER NECK: Normal pharynx, larynx and major salivary glands. No cervical lymphadenopathy. Unremarkable thyroid gland. UPPER CHEST: No pneumothorax or pleural effusion. No nodules or masses. AORTIC ARCH: There is no calcific atherosclerosis of the aortic arch. There is no aneurysm, dissection or hemodynamically significant stenosis of the visualized portion of the aorta. Conventional 3 vessel aortic branching pattern. The visualized proximal subclavian arteries are widely patent. RIGHT CAROTID SYSTEM: Normal without aneurysm, dissection or stenosis. LEFT CAROTID SYSTEM:  Normal without aneurysm, dissection or stenosis. VERTEBRAL ARTERIES: Left dominant configuration. Both origins are clearly patent. There is no dissection, occlusion or flow-limiting stenosis to the skull base (V1-V3 segments). CTA HEAD FINDINGS POSTERIOR CIRCULATION: --Vertebral arteries: Normal V4 segments. --Inferior cerebellar arteries: Normal. --Basilar artery: Normal. --Superior cerebellar arteries: Normal. --Posterior cerebral arteries (PCA): Normal. ANTERIOR CIRCULATION: --Intracranial internal carotid arteries: Normal. --Anterior cerebral arteries (ACA): Normal. Both A1 segments are present. Patent anterior communicating artery (a-comm). --Middle cerebral arteries (MCA): There is occlusion the proximal right M2 superior division (series 7 images 266-269). Moderate collateralization. Left MCA is normal. VENOUS SINUSES: As permitted by contrast  timing, patent. ANATOMIC VARIANTS: None Review of the MIP images confirms the above findings. IMPRESSION: 1. Occlusion of the proximal right M2 superior division with moderate collateralization. The location is in keeping with the patient's symptoms. 2. No other intracranial arterial occlusion or high-grade stenosis. 3. Normal cervical carotid and vertebral arteries. Critical Value/emergent results were called by telephone at the time of interpretation on 08/12/2020 at 8:32 pm to provider [facility] , who verbally acknowledged these results. Electronically Signed   By: Ulyses Jarred M.D.   On: 08/12/2020 20:35   CT Angio Neck W and/or Wo Contrast  Result Date: 08/12/2020 CLINICAL DATA:  Left-sided numbness EXAM: CT ANGIOGRAPHY HEAD AND NECK TECHNIQUE: Multidetector CT imaging of the head and neck was performed using the standard protocol during bolus administration of intravenous contrast. Multiplanar CT image reconstructions and MIPs were obtained to evaluate the vascular anatomy. Carotid stenosis measurements (when applicable) are obtained utilizing NASCET criteria, using the distal internal carotid diameter as the denominator. CONTRAST:  73m OMNIPAQUE IOHEXOL 350 MG/ML SOLN COMPARISON:  None. FINDINGS: CTA NECK FINDINGS SKELETON: There is no bony spinal canal stenosis. No lytic or blastic lesion. OTHER NECK: Normal pharynx, larynx and major salivary glands. No cervical lymphadenopathy. Unremarkable thyroid gland. UPPER CHEST: No pneumothorax or pleural effusion. No nodules or masses. AORTIC ARCH: There is no calcific atherosclerosis of the aortic arch. There is no aneurysm, dissection or hemodynamically significant stenosis of the visualized portion of the aorta. Conventional 3 vessel aortic branching pattern. The visualized proximal subclavian arteries are widely patent. RIGHT CAROTID SYSTEM: Normal without aneurysm, dissection or stenosis. LEFT CAROTID SYSTEM: Normal without aneurysm, dissection or  stenosis. VERTEBRAL ARTERIES: Left dominant configuration. Both origins are clearly patent. There is no dissection, occlusion or flow-limiting stenosis to the skull base (V1-V3 segments). CTA HEAD FINDINGS POSTERIOR CIRCULATION: --Vertebral arteries: Normal V4 segments. --Inferior cerebellar arteries: Normal. --Basilar artery: Normal. --Superior cerebellar arteries: Normal. --Posterior cerebral arteries (PCA): Normal. ANTERIOR CIRCULATION: --Intracranial internal carotid arteries: Normal. --Anterior cerebral arteries (ACA): Normal. Both A1 segments are present. Patent anterior communicating artery (a-comm). --Middle cerebral arteries (MCA): There is occlusion the proximal right M2 superior division (series 7 images 266-269). Moderate collateralization. Left MCA is normal. VENOUS SINUSES: As permitted by contrast timing, patent. ANATOMIC VARIANTS: None Review of the MIP images confirms the above findings. IMPRESSION: 1. Occlusion of the proximal right M2 superior division with moderate collateralization. The location is in keeping with the patient's symptoms. 2. No other intracranial arterial occlusion or high-grade stenosis. 3. Normal cervical carotid and vertebral arteries. Critical Value/emergent results were called by telephone at the time of interpretation on 08/12/2020 at 8:32 pm [facility], who verbally acknowledged these results. Electronically Signed   By: KUlyses JarredM.D.   On: 08/12/2020 20:35   CT CEREBRAL PERFUSION W CONTRAST  Result Date: 08/12/2020 CLINICAL DATA:  Right MCA stroke EXAM: CT PERFUSION BRAIN TECHNIQUE: Multiphase CT imaging of the brain was performed following IV bolus contrast injection. Subsequent parametric perfusion maps were calculated using RAPID software. CONTRAST:  72m OMNIPAQUE IOHEXOL 350 MG/ML SOLN COMPARISON:  None. FINDINGS: CT Brain Perfusion Findings: CBF (<30%) Volume: 151mPerfusion (Tmax>6.0s) volume: 6077mismatch Volume: 72m72mPECTS on noncontrast  CT Head: 10 at 8:21 p.m. today. Infarct Core: 11 mL Infarction Location:Posterosuperior right MCA territory IMPRESSION: 11 mL core infarct in the right MCA territory with 49 mL surrounding ischemic penumbra. Electronically Signed   By: KeviUlyses Jarred.   On: 08/12/2020 21:41   CT HEAD CODE STROKE WO CONTRAST  Result Date: 08/12/2020 CLINICAL DATA:  Code stroke.  Left-sided numbness EXAM: CT HEAD WITHOUT CONTRAST TECHNIQUE: Contiguous axial images were obtained from the base of the skull through the vertex without intravenous contrast. COMPARISON:  None. FINDINGS: Brain: There is no mass, hemorrhage or extra-axial collection. The size and configuration of the ventricles and extra-axial CSF spaces are normal. The brain parenchyma is normal, without evidence of acute or chronic infarction. Vascular: No abnormal hyperdensity of the major intracranial arteries or dural venous sinuses. No intracranial atherosclerosis. Skull: The visualized skull base, calvarium and extracranial soft tissues are normal. Sinuses/Orbits: No fluid levels or advanced mucosal thickening of the visualized paranasal sinuses. No mastoid or middle ear effusion. The orbits are normal. ASPECTS ([facility] Early CT Score) - Ganglionic level infarction (caudate, lentiform nuclei, internal capsule, insula, M1-M3 cortex): 7 - Supraganglionic infarction (M4-M6 cortex): 3 Total score (0-10 with 10 being normal): 10 IMPRESSION: 1. Normal head CT. 2. ASPECTS is 10. These results were called by telephone at the time of interpretation on 08/12/2020 at 8:21 pm to provider [facility] these results. Electronically Signed   By: KeviUlyses Jarred.   On: 08/12/2020 20:21    PHYSICAL EXAM  Temp:  [97.7 F (36.5 C)-98.7 F (37.1 C)] 97.7 F (36.5 C) (12/03 0800) Pulse Rate:  [62-92] 65 (12/03 1000) Resp:  [0-28] 12 (12/03 1000) BP: (113-162)/(71-116) 134/89 (12/03 1000) SpO2:  [95 %-100 %] 97 % (12/03  1000) Weight:  [108.9 kg-109.5 kg] 109.5 kg (12/02 2033)  General - Well nourished, well developed, in no apparent distress.  Ophthalmologic - fundi not visualized due to noncooperation.  Cardiovascular - Regular rhythm and rate.  Mental Status -  Level of arousal and orientation to time, place, and person were intact. Language including expression, naming, repetition, comprehension was assessed and found intact. Attention span and concentration were normal. Fund of Knowledge was assessed and was intact.  Cranial Nerves II - XII - II - Visual field intact OU. III, IV, VI - Extraocular movements intact. V - Facial sensation intact bilaterally. VII - Facial movement intact bilaterally. VIII - Hearing & vestibular intact bilaterally. X - Palate elevates symmetrically. XI - Chin turning & shoulder shrug intact bilaterally. XII - Tongue protrusion intact.  Motor Strength - The patient's strength was normal in all extremities and pronator drift was absent.  Bulk was normal and fasciculations were absent.   Motor Tone - Muscle tone was assessed at the neck and appendages and was normal.  Reflexes - The patient's reflexes were symmetrical in all extremities and he had no pathological reflexes.  Sensory - Light touch, temperature/pinprick were assessed and were symmetrical, although subjectively feeling not normal on the right arm and leg sensation.  Coordination - The patient had normal movements in the hands  and feet with no ataxia or dysmetria.  Tremor was absent.  Gait and Station - deferred.   ASSESSMENT/PLAN Mr. Rayshon Albaugh is a 34 y.o. male with history of  congenital aortic stenosis and regurgitation status post aortic valve replacement x2 (at age 41 and then again in 5697) complicated by aortic dilation status post graft (2015 with valve replacement). He reports he was recommended to take aspirin for 6 weeks after his last surgery in 2015. Otherwise he has not been on any  medications. He is to follow with Dr. Festus Holts at [facility] (last seen in 2016 due to lapse in insurance)  presenting to [facility] ED with acute onset L sided weakness and evaluated by teleneurology and treated w/ tPA 08/12/2020 at 2059. Transferred to [facility] for post tPA care.    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 ASPECTS 10.     CTA head & neck proximal R M2 superior division occlusion w/ moderate collateralization.   CT perfusion 60m R MCA core w/ 448mpenumbra  MRI pending  MRA pending  2D Echo EF 60 to 65%.  Severe LV hypertrophy, severe AV thickening  LE venous Doppler no DVT  Will do TEE on Monday - pt is thinking about loop recorder  LDL 144  HgbA1c 4.6  Hypercoagulable pending  ANA pending  ESR and CRP neg  UDS neg  VTE prophylaxis - SCDs   No antithrombotic prior to admission, now on No antithrombotic as within 24h administration of tPA.   Therapy recommendations:  pending   Disposition:  pending   Hyperlipidemia  Home meds:  No statin listed  LDL 144, goal < 70  Add Lipitor 40  Continue statin at discharge  Elevated TSH  TSH 9.55 (H)  Repeat TSH, free T4 in a.m.  Hx congenital aortic stenosis and regurgitation   status post aortic valve replacement x2 (at age [age]nd then again in 209480complicated by aortic dilation status post graft (2015 with valve replacement).   Only recommended to take aspirin for 6 weeks after his last surgery in 2015.   [facility] (last seen in 2016 due to lapse in insurance).  [place]   Other Stroke Risk Factors  Occasional ETOH use  Obesity, Body mass index is 33.68 kg/m., BMI >/= 30 associated with  increased stroke risk, recommend weight loss, diet and exercise as appropriate   Other Active Problems  Hypokalemia 3.4->3.3 -supplement  Hospital day # 0  This patient is critically ill due to right MCA stroke status post TPA, right M2 occlusion, history of congenital aortic stenosis status post surgery and at significant risk of neurological worsening, death form recurrent stroke, hemorrhagic conversion, hemorrhage from TPA. This patient's care requires constant monitoring of vital signs, hemodynamics, respiratory and cardiac monitoring, review of multiple databases, neurological assessment, discussion with family, other specialists and medical decision making of high complexity. I spent 35 minutes of neurocritical care time in the care of this patient.   JiRosalin HawkingMD PhD Stroke Neurology 08/13/2020 8:03 PM  To contact Stroke Continuity provider, please refer to [email or web address] hours, contact General Neurology

## 2020-08-13 NOTE — Consult Note (Signed)
Patient Status: [name] - In-pt  Assessment and Plan: Patient presented to [facility] ED with LUE sensation loss. Code Stroke activated and patient transferred to [facility] for ongoing assessment with Neuro.   NIR was consulted for intervention of the right M2 occlusion, however patient was reluctant to proceed.  He did agree to proceed at one point, however ultimately rescinded his consent and no procedure was performed.  Patient was seen at bedside this AM.  Reports his symptoms have improved.  States he only has 50% reduced sensation on the left now compared to right.  Followed by Neuro who is planning to get MRA/MR Brain today.    ______________________________________________________________________   History of Present Illness: Anthony Buckley is a 34 y.o. male with past medical history of congenital aortic stenosis and regurgitation s/p aortic valve replacement x2 who presented with left-sided numbness and left facial droop.  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  Intervention with NIR was discussed and offered to the patient.  He was initially reluctant, then agreed, then rescinded-- ultimately he did not undergo intervention.  Allergies and medications reviewed.   Review of Systems: A 12 point ROS discussed and pertinent positives are indicated in the HPI above.  All other systems are negative.  Review of Systems  Constitutional: Negative for fatigue and fever.  Respiratory: Negative for cough and shortness of breath.   Cardiovascular: Negative for chest pain.  Gastrointestinal: Negative for abdominal pain, nausea and vomiting.  Genitourinary: Negative for dysuria.  Musculoskeletal: Negative for back pain.  Neurological: Negative for headaches.  Psychiatric/Behavioral: Negative for behavioral problems and confusion.    Vital Signs: BP (!) 147/99 (BP Location: Right Arm)    Pulse 70   Temp 98.1 F (36.7 C) (Oral)   Resp 11   Ht 5\' 11"  (1.803 m)   Wt 241 lb 8 oz (109.5 kg)   SpO2 98%   BMI 33.68 kg/m   Physical Exam Vitals and nursing note reviewed.  Constitutional:      Appearance: Normal appearance. He is normal weight. He is not ill-appearing.  HENT:     Mouth/Throat:     Mouth: Mucous membranes are moist.     Pharynx: Oropharynx is clear.  Cardiovascular:     Rate and Rhythm: Normal rate and regular rhythm.  Pulmonary:     Effort: Pulmonary effort is normal. No respiratory distress.     Breath sounds: Normal breath sounds.  Abdominal:     General: Abdomen is flat.     Palpations: Abdomen is soft.  Skin:    General: Skin is warm and dry.  Neurological:     Mental Status: He is alert.     Sensory: Sensory deficit (reduced sensation to left hand and arm) present.  Psychiatric:        Mood and Affect: Mood normal.        Behavior: Behavior normal.        Thought Content: Thought content normal.        Judgment: Judgment normal.      Imaging reviewed.   Labs:  COAGS: Recent Labs    08/12/20 2003  INR 1.1  APTT 29    BMP: Recent Labs    08/12/20 2003 08/12/20 2036 08/13/20 0425  NA 138 141 140  K 3.4* 3.5 3.3*  CL 103 102 105  CO2 26  --  24  GLUCOSE 84 77 87  BUN 12 11 12   CALCIUM 8.7*  --  9.2  CREATININE 1.17 1.10 1.06  GFRNONAA >60  --  >60       Electronically Signed: , PA 08/13/2020, 1:06 PM   I spent a total of 15 minutes in face to face in clinical consultation, greater than 50% of which was counseling/coordinating care for venous access.

## 2020-08-13 NOTE — Progress Notes (Signed)
PT Cancellation Note  Patient Details Name: Anthony Buckley MRN: [id number] DOB: 1986/01/09   Cancelled Treatment:    Reason Eval/Treat Not Completed: Medical issues which prohibited therapy at this time as pt still has active bedrest order. PT will continue to follow and evaluate when medically appropriate.   Deland Pretty, DPT   Acute Rehabilitation Department Pager #: [phone]   Gaetana Michaelis 08/13/2020, 12:41 PM

## 2020-08-13 NOTE — Evaluation (Signed)
Physical Therapy Evaluation Patient Details Name: Anthony Buckley MRN: [id number] DOB: 08/30/1986 Today's Date: 08/13/2020   History of Present Illness  34 yo male admitted with L side weaness and numbness. R MCA M2 storke s/p TPA PMH congential aortic stenosis and regurgitation s/p aortic valve replacement x2 ( age 59 then again 2015) complicated by aortic dilation s/p graft (2015)   Clinical Impression  Pt in bed upon arrival of PT, agreeable to evaluation at this time. Prior to admission the pt was completely independent with mobility and ADLs, living with his brother and sister-in-law in a 2-story home. The pt now presents with minor limitations in L-sided extremity sensation due to above dx, but is safe to return home with family support as needed. The pt was able to demo good independence with bed mobility and initial transfers as well as hallway ambulation and stair navigation without evidence of instability. The pt reports his mobility feels to be at baseline level of function and has no further questions or concerns at this time. The pt has no further acute PT needs, thank you for the consult.  Dynamic Gait Index (DGI): 24/24 (<19 indicates increased risk for falls)     Follow Up Recommendations No PT follow up    Equipment Recommendations  None recommended by PT    Recommendations for Other Services       Precautions / Restrictions Precautions Precautions: None Restrictions Weight Bearing Restrictions: No      Mobility  Bed Mobility Overal bed mobility: Independent                  Transfers Overall transfer level: Independent                  Ambulation/Gait Ambulation/Gait assistance: Independent Gait Distance (Feet): 500 Feet Assistive device: None Gait Pattern/deviations: WFL(Within Functional Limits) Gait velocity: 1.33 m/s Gait velocity interpretation: >2.62 ft/sec, indicative of community ambulatory General Gait Details: WFL without evidence  of instability  Stairs Stairs: Yes Stairs assistance: Supervision Stair Management: No rails;Alternating pattern;Forwards Number of Stairs: 3 (x3) General stair comments: no use of rail, alternating pattern  Wheelchair Mobility    Modified Rankin (Stroke Patients Only) Modified Rankin (Stroke Patients Only) Pre-Morbid Rankin Score: No symptoms Modified Rankin: Moderate disability     Balance Overall balance assessment: Independent                               Standardized Balance Assessment Standardized Balance Assessment : Dynamic Gait Index   Dynamic Gait Index Level Surface: Normal Change in Gait Speed: Normal Gait with Horizontal Head Turns: Normal Gait with Vertical Head Turns: Normal Gait and Pivot Turn: Normal Step Over Obstacle: Normal Step Around Obstacles: Normal Steps: Normal Total Score: 24       Pertinent Vitals/Pain Pain Assessment: No/denies pain    Home Living Family/patient expects to be discharged to:: Private residence Living Arrangements: Other (Comment) (brother and sister in law) Available Help at Discharge: Available PRN/intermittently (sister in law home in [facility], brother works from home) Type of Home: House Home Access: Stairs to enter (ramped option) Entrance Stairs-Rails: None Entrance Stairs-Number of Steps: 2 Home Layout: Two level Home Equipment: Environmental consultant - 2 wheels;Tub bench Additional Comments: downstairs with tub shower and bench    Prior Function Level of Independence: Independent         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 the difference is very minimal per his report    Lower Extremity Assessment Lower Extremity Assessment: LLE  deficits/detail LLE Deficits / Details: reports sensation ~90% of RLE LLE Sensation: decreased light touch    Cervical / Trunk Assessment Cervical / Trunk Assessment: Normal  Communication   Communication: No difficulties  Cognition Arousal/Alertness: Awake/alert Behavior During Therapy: WFL for tasks assessed/performed Overall Cognitive Status: Within Functional Limits for tasks assessed                                        General Comments General comments (skin integrity, edema, etc.): VSS on RA    Exercises     Assessment/Plan    PT Assessment Patent does not need any further PT services         PT Goals (Current goals can be found in the Care Plan section)  Acute Rehab PT Goals Patient Stated Goal: return to work PT Goal Formulation: With patient Time For Goal Achievement: 08/27/20 Potential to Achieve Goals: Good     AM-PAC PT "6 Clicks" Mobility  Outcome Measure Help needed turning from your back to your side while in a flat bed without using bedrails?: None Help needed moving from lying on your back to sitting on the side of a flat bed without using bedrails?: None Help needed moving to and from a bed to a chair (including a wheelchair)?: None Help needed standing up from a chair using your arms (e.g., wheelchair or bedside chair)?: None Help needed to walk in hospital room?: None Help needed climbing 3-5 steps with a railing? : A Little 6 Click Score: 23    End of Session   Activity Tolerance: Patient tolerated treatment well Patient left: in chair;with family/visitor present Nurse Communication: Mobility status PT Visit Diagnosis: Other abnormalities of gait and mobility (R26.89);Hemiplegia and hemiparesis Hemiplegia - Right/Left: Left Hemiplegia - dominant/non-dominant: Non-dominant Hemiplegia - caused by: Cerebral infarction    Time: 5284-1324 PT Time Calculation (min) (ACUTE ONLY): 21 min   Charges:   PT Evaluation $PT Eval  Low Complexity: 1 Low          Rolm Baptise, PT, DPT   Acute Rehabilitation Department Pager #: [phone]  Gaetana Michaelis 08/13/2020, 4:15 PM

## 2020-08-14 LAB — BASIC METABOLIC PANEL
Anion gap: 10 (ref 5–15)
BUN: 10 mg/dL (ref 6–20)
CO2: 25 mmol/L (ref 22–32)
Calcium: 9.6 mg/dL (ref 8.9–10.3)
Chloride: 105 mmol/L (ref 98–111)
Creatinine, Ser: 1.03 mg/dL (ref 0.61–1.24)
GFR, Estimated: >60 mL/min (ref >60)
Glucose, Bld: 83 mg/dL (ref 70–99)
Potassium: 3.8 mmol/L (ref 3.5–5.1)
Sodium: 140 mmol/L (ref 135–145)

## 2020-08-14 LAB — T4, FREE: Free T4: 0.88 ng/dL (ref 0.61–1.12)

## 2020-08-14 LAB — TSH: TSH: 5.288 u[IU]/mL — ABNORMAL HIGH (ref 0.350–4.500)

## 2020-08-14 MED ORDER — ASPIRIN EC 81 MG PO TBEC
81.0000 mg | DELAYED_RELEASE_TABLET | Freq: Every day | ORAL | Status: DC
  Administered 2020-08-14 – 2020-08-16 (×3): 81 mg via ORAL
  Filled 2020-08-14 (×3): qty 1

## 2020-08-14 NOTE — Progress Notes (Signed)
Received patient from 4NICU via wheelchair; patient is alert and oriented; oriented to room and unit routine.

## 2020-08-14 NOTE — Progress Notes (Signed)
STROKE TEAM PROGRESS NOTE   INTERVAL HISTORY Patient is sitting up in bed.  His mother is at the bedside.  He states is made of complete recovery.  He has no complaints or neurological deficits today.  Vital signs are stable.  He has had aortic valve replacement surgery x2 at [facility] clinic and is followed by cardiologist Dr. Laurann Montana in [place].  He recently moved to [facility] but is not sure if he will stay her long-term. Vitals:   08/14/20 0100 08/14/20 0200 08/14/20 0300 08/14/20 0400  BP: (!) 136/96 125/82 130/76 135/82  Pulse: 64 73 (!) 59 (!) 59  Resp: $Remo'15 17 14 12  'SBLeB$ Temp:    98.3 F (36.8 C)  TempSrc:    Oral  SpO2: 95% 93% 95% 92%  Weight:      Height:       CBC:  Recent Labs  Lab 08/12/20 2003 08/12/20 2003 08/12/20 2036 08/13/20 0425  WBC 5.9  --   --  7.0  NEUTROABS 4.0  --   --  4.0  HGB 14.9   < > 13.9 14.9  HCT 42.5   < > 41.0 41.2  MCV 88.5  --   --  85.8  PLT 167  --   --  169   < > = values in this interval not displayed.   Basic Metabolic Panel:  Recent Labs  Lab 08/13/20 0425 08/14/20 0106  NA 140 140  K 3.3* 3.8  CL 105 105  CO2 24 25  GLUCOSE 87 83  BUN 12 10  CREATININE 1.06 1.03  CALCIUM 9.2 9.6   Lipid Panel:  Recent Labs  Lab 08/13/20 0425  CHOL 189  TRIG 101  HDL 25*  CHOLHDL 7.6  VLDL 20  LDLCALC 144*   HgbA1c:  Recent Labs  Lab 08/13/20 0425  HGBA1C 4.6*   Urine Drug Screen:  Recent Labs  Lab 08/13/20 1342  LABOPIA NONE DETECTED  COCAINSCRNUR NONE DETECTED  LABBENZ NONE DETECTED  AMPHETMU NONE DETECTED  THCU NONE DETECTED  LABBARB NONE DETECTED    Alcohol Level  Recent Labs  Lab 08/12/20 2027  ETH <10    IMAGING past 24 hours MR ANGIO HEAD WO CONTRAST  Result Date: 08/13/2020 CLINICAL DATA:  Stroke, follow-up. EXAM: MRI HEAD WITHOUT CONTRAST MRA HEAD WITHOUT CONTRAST TECHNIQUE: Multiplanar, multiecho pulse sequences of the brain and surrounding structures were obtained without intravenous contrast.  Angiographic images of the head were obtained using MRA technique without contrast. COMPARISON:  Noncontrast head CT, CT angiogram head/neck and CT perfusion 08/12/2020. FINDINGS: MRI HEAD FINDINGS Brain: Cerebral volume is normal. Restricted diffusion within the mid and posterior right insula, extending slightly into the adjacent right frontoparietal operculum. Findings are compatible with acute/early subacute infarction. Additional scattered tiny acute/early subacute cortical/subcortical infarcts within the right frontoparietal operculum and more posteriorly within the right parietal lobe. Corresponding T2/FLAIR hyperintensity at these sites. There are a few scattered supratentorial chronic microhemorrhages, nonspecific. No evidence of intracranial mass. No extra-axial fluid collection. No midline shift. Vascular: Reported below. Skull and upper cervical spine: No focal marrow lesion Sinuses/Orbits: Visualized orbits show no acute finding. Trace ethmoid sinus mucosal thickening. Small left maxillary sinus mucous retention cysts MRA HEAD FINDINGS The intracranial internal carotid arteries are patent. The M1 middle cerebral arteries are patent. Persistent apparent flow gap within a proximal M2 right MCA vessel which may reflect segmental occlusion with distal reconstitution or severe segmental stenosis (series 1034, image 16). Robust flow related signal more  distally within this vessel. No M2 proximal branch occlusion or high-grade proximal stenosis identified elsewhere. The anterior cerebral arteries are patent. The intracranial vertebral arteries are patent. The basilar artery is patent. The posterior cerebral arteries are patent. No intracranial aneurysm is identified. IMPRESSION: MRI brain: 1. Acute/early subacute cortical infarct within the right insula, extending slightly into the right frontoparietal operculum. Additional tiny scattered acute/early subacute cortical and subcortical infarcts within the right  frontoparietal operculum and more posteriorly within the right parietal lobe. No mass effect. No evidence of hemorrhagic conversion. 2. A few scattered supratentorial chronic microhemorrhages are nonspecific. MRA head: Persistent apparent flow gap within a proximal M2 right MCA vessel, which may reflect segmental occlusion with distal reconstitution or severe segmental stenosis. Electronically Signed   By: Kellie Simmering DO   On: 08/13/2020 22:23   MR BRAIN WO CONTRAST  Result Date: 08/13/2020 CLINICAL DATA:  Stroke, follow-up. EXAM: MRI HEAD WITHOUT CONTRAST MRA HEAD WITHOUT CONTRAST TECHNIQUE: Multiplanar, multiecho pulse sequences of the brain and surrounding structures were obtained without intravenous contrast. Angiographic images of the head were obtained using MRA technique without contrast. COMPARISON:  Noncontrast head CT, CT angiogram head/neck and CT perfusion 08/12/2020. FINDINGS: MRI HEAD FINDINGS Brain: Cerebral volume is normal. Restricted diffusion within the mid and posterior right insula, extending slightly into the adjacent right frontoparietal operculum. Findings are compatible with acute/early subacute infarction. Additional scattered tiny acute/early subacute cortical/subcortical infarcts within the right frontoparietal operculum and more posteriorly within the right parietal lobe. Corresponding T2/FLAIR hyperintensity at these sites. There are a few scattered supratentorial chronic microhemorrhages, nonspecific. No evidence of intracranial mass. No extra-axial fluid collection. No midline shift. Vascular: Reported below. Skull and upper cervical spine: No focal marrow lesion Sinuses/Orbits: Visualized orbits show no acute finding. Trace ethmoid sinus mucosal thickening. Small left maxillary sinus mucous retention cysts MRA HEAD FINDINGS The intracranial internal carotid arteries are patent. The M1 middle cerebral arteries are patent. Persistent apparent flow gap within a proximal M2 right  MCA vessel which may reflect segmental occlusion with distal reconstitution or severe segmental stenosis (series 1034, image 16). Robust flow related signal more distally within this vessel. No M2 proximal branch occlusion or high-grade proximal stenosis identified elsewhere. The anterior cerebral arteries are patent. The intracranial vertebral arteries are patent. The basilar artery is patent. The posterior cerebral arteries are patent. No intracranial aneurysm is identified. IMPRESSION: MRI brain: 1. Acute/early subacute cortical infarct within the right insula, extending slightly into the right frontoparietal operculum. Additional tiny scattered acute/early subacute cortical and subcortical infarcts within the right frontoparietal operculum and more posteriorly within the right parietal lobe. No mass effect. No evidence of hemorrhagic conversion. 2. A few scattered supratentorial chronic microhemorrhages are nonspecific. MRA head: Persistent apparent flow gap within a proximal M2 right MCA vessel, which may reflect segmental occlusion with distal reconstitution or severe segmental stenosis. Electronically Signed   By: Kellie Simmering DO   On: 08/13/2020 22:23   ECHOCARDIOGRAM COMPLETE  Result Date: 08/13/2020    ECHOCARDIOGRAM REPORT   Patient Name:   OLIVIA ROYSE Date of Exam: 08/13/2020 Medical Rec #:  [id number]      Height:       71.0 in Accession #:    [id number]     Weight:       241.5 lb Date of Birth:  [birth year]      BSA:          2.284 m Patient Age:    72 years  BP:           156/71 mmHg Patient Gender: M              HR:           62 bpm. Exam Location:  Inpatient Procedure: 2D Echo, Cardiac Doppler and Color Doppler Indications:    Stroke  History:        Patient has no prior history of Echocardiogram examinations.                 Aortic Valve Disease. Congenital aortic stenosis and                 regurgitation status post aortic valve replacement x2 (at age 71                 and then again  in 8250) complicated by aortic dilation status                 post graft (2015 with valve replacement). Patient unaware of AVR                 size. Has followed with Dr. Macarthur Critchley at [facility].  Sonographer:    Clayton Lefort RDCS (AE) Referring Phys: [id number] [place]  1. Left ventricular ejection fraction, by estimation, is 60 to 65%. The left ventricle has normal function. The left ventricle has no regional wall motion abnormalities. There is severe left ventricular hypertrophy. Left ventricular diastolic parameters  are indeterminate.  2. Right ventricular systolic function is normal. The right ventricular size is normal. There is normal pulmonary artery systolic pressure.  3. Right atrial size was mildly dilated.  4. The mitral valve is normal in structure. No evidence of mitral valve regurgitation.  5. This aortic valve gradient is likely normla for this valve. No prior echo for comparison. . The aortic valve has been repaired/replaced. There is moderate calcification of the aortic valve. There is severe thickening of the aortic valve. Aortic valve  regurgitation is trivial.  6. S/p aortic root replacement . Aortic root/ascending aorta has been repaired/replaced. FINDINGS  Left Ventricle: Left ventricular ejection fraction, by estimation, is 60 to 65%. The left ventricle has normal function. The left ventricle has no regional wall motion abnormalities. The left ventricular internal cavity size was normal in size. There is  severe left ventricular hypertrophy. Left ventricular diastolic parameters are indeterminate. Right Ventricle: The right ventricular size is normal. No increase in right ventricular wall thickness. Right ventricular systolic function is normal. There is normal pulmonary artery systolic pressure. The tricuspid regurgitant velocity is 2.28 m/s, and  with an assumed right atrial pressure of 3 mmHg, the estimated right ventricular systolic pressure is 89.1 mmHg.  Left Atrium: Left atrial size was normal in size. Right Atrium: Right atrial size was mildly dilated. Pericardium: There is no evidence of pericardial effusion. Mitral Valve: The mitral valve is normal in structure. No evidence of mitral valve regurgitation. MV peak gradient, 7.2 mmHg. The mean mitral valve gradient is 2.0 mmHg. Tricuspid Valve: The tricuspid valve is normal in structure. Tricuspid valve regurgitation is mild. Aortic Valve: This aortic valve gradient is likely normla for this valve. No prior echo for comparison. The aortic valve has been repaired/replaced. There is moderate calcification of the aortic valve. There is severe thickening of the aortic valve. There is mild aortic valve annular calcification. Aortic valve regurgitation is trivial. Aortic regurgitation PHT measures 639 msec. Aortic valve mean gradient measures  11.3 mmHg. Aortic valve peak gradient measures 20.7 mmHg. Aortic valve area, by VTI measures 2.23 cm. Pulmonic Valve: The pulmonic valve was normal in structure. Pulmonic valve regurgitation is not visualized. Aorta: S/p aortic root replacement. The aortic root/ascending aorta has been repaired/replaced. IAS/Shunts: The atrial septum is grossly normal.  LEFT VENTRICLE PLAX 2D LVIDd:         4.60 cm  Diastology LVIDs:         3.10 cm  LV e' medial:    7.18 cm/s LV PW:         1.70 cm  LV E/e' medial:  17.4 LV IVS:        1.40 cm  LV e' lateral:   9.57 cm/s LVOT diam:     2.20 cm  LV E/e' lateral: 13.1 LV SV:         109 LV SV Index:   48 LVOT Area:     3.80 cm  RIGHT VENTRICLE            IVC RV Basal diam:  3.30 cm    IVC diam: 1.70 cm RV S prime:     9.90 cm/s TAPSE (M-mode): 2.3 cm LEFT ATRIUM             Index       RIGHT ATRIUM           Index LA diam:        3.30 cm 1.44 cm/m  RA Area:     20.70 cm LA Vol (A2C):   53.6 ml 23.46 ml/m RA Volume:   61.30 ml  26.83 ml/m LA Vol (A4C):   58.0 ml 25.39 ml/m LA Biplane Vol: 56.1 ml 24.56 ml/m  AORTIC VALVE AV Area (Vmax):     2.14 cm AV Area (Vmean):   2.04 cm AV Area (VTI):     2.23 cm AV Vmax:           227.33 cm/s AV Vmean:          158.667 cm/s AV VTI:            0.487 m AV Peak Grad:      20.7 mmHg AV Mean Grad:      11.3 mmHg LVOT Vmax:         128.00 cm/s LVOT Vmean:        85.300 cm/s LVOT VTI:          0.286 m LVOT/AV VTI ratio: 0.59 AI PHT:            639 msec  AORTA Ao Root diam: 3.50 cm MITRAL VALVE                TRICUSPID VALVE MV Area (PHT): 3.17 cm     TR Peak grad:   20.8 mmHg MV Peak grad:  7.2 mmHg     TR Vmax:        228.00 cm/s MV Mean grad:  2.0 mmHg MV Vmax:       1.34 m/s     SHUNTS MV Vmean:      71.0 cm/s    Systemic VTI:  0.29 m MV Decel Time: 239 msec     Systemic Diam: 2.20 cm MV E velocity: 125.00 cm/s MV A velocity: 62.60 cm/s MV E/A ratio:  2.00 Mertie Moores MD Electronically signed by Mertie Moores MD Signature Date/Time: 08/13/2020/2:49:42 PM    Final    VAS [place] LOWER EXTREMITY VENOUS (DVT)  Result Date: 08/13/2020  Lower Venous DVT Study Other Indications: Embolic stroke. Comparison Study: No previous for comparison Performing Technologist: Vonzell Schlatter  Examination Guidelines: A complete evaluation includes B-mode imaging, spectral Doppler, color Doppler, and power Doppler as needed of all accessible portions of each vessel. Bilateral testing is considered an integral part of a complete examination. Limited examinations for reoccurring indications may be performed as noted. The reflux portion of the exam is performed with the patient in reverse Trendelenburg.  +---------+---------------+---------+-----------+----------+--------------+ RIGHT    CompressibilityPhasicitySpontaneityPropertiesThrombus Aging +---------+---------------+---------+-----------+----------+--------------+ CFV      Full           Yes      Yes                                 +---------+---------------+---------+-----------+----------+--------------+ SFJ      Full                                                         +---------+---------------+---------+-----------+----------+--------------+ FV Prox  Full                                                        +---------+---------------+---------+-----------+----------+--------------+ FV Mid   Full                                                        +---------+---------------+---------+-----------+----------+--------------+ FV DistalFull                                                        +---------+---------------+---------+-----------+----------+--------------+ PFV      Full                                                        +---------+---------------+---------+-----------+----------+--------------+ POP      Full           Yes      Yes                                 +---------+---------------+---------+-----------+----------+--------------+ PTV      Full                                                        +---------+---------------+---------+-----------+----------+--------------+ PERO     Full                                                        +---------+---------------+---------+-----------+----------+--------------+   +---------+---------------+---------+-----------+----------+--------------+  LEFT     CompressibilityPhasicitySpontaneityPropertiesThrombus Aging +---------+---------------+---------+-----------+----------+--------------+ CFV      Full           Yes      Yes                                 +---------+---------------+---------+-----------+----------+--------------+ SFJ      Full                                                        +---------+---------------+---------+-----------+----------+--------------+ FV Prox  Full                                                        +---------+---------------+---------+-----------+----------+--------------+ FV Mid   Full                                                         +---------+---------------+---------+-----------+----------+--------------+ FV DistalFull                                                        +---------+---------------+---------+-----------+----------+--------------+ PFV      Full           Yes      Yes                                 +---------+---------------+---------+-----------+----------+--------------+ POP      Full                                                        +---------+---------------+---------+-----------+----------+--------------+ PTV      Full                                                        +---------+---------------+---------+-----------+----------+--------------+ PERO     Full                                                        +---------+---------------+---------+-----------+----------+--------------+     Summary: RIGHT: - There is no evidence of deep vein thrombosis in the lower extremity.  - No cystic structure found in the popliteal fossa.  LEFT: - There is no evidence of deep vein thrombosis in the lower extremity.  - No  cystic structure found in the popliteal fossa.  *See table(s) above for measurements and observations.    Preliminary     PHYSICAL EXAM    Temp:  [98 F (36.7 C)-98.3 F (36.8 C)] 98.3 F (36.8 C) (12/04 0400) Pulse Rate:  [59-78] 59 (12/04 0400) Resp:  [11-23] 12 (12/04 0400) BP: (98-148)/(65-99) 135/82 (12/04 0400) SpO2:  [92 %-99 %] 92 % (12/04 0400)  General - Well nourished, well developed, in no apparent distress.  Ophthalmologic - fundi not visualized due to noncooperation.  Cardiovascular - Regular rhythm and rate.  Mental Status -  Level of arousal and orientation to time, place, and person were intact. Language including expression, naming, repetition, comprehension was assessed and found intact. Attention span and concentration were normal. Fund of Knowledge was assessed and was intact.  Cranial Nerves II - XII - II - Visual field  intact OU. III, IV, VI - Extraocular movements intact. V - Facial sensation intact bilaterally. VII - Facial movement intact bilaterally. VIII - Hearing & vestibular intact bilaterally. X - Palate elevates symmetrically. XI - Chin turning & shoulder shrug intact bilaterally. XII - Tongue protrusion intact.  Motor Strength - The patient's strength was normal in all extremities and pronator drift was absent.  Bulk was normal and fasciculations were absent.   Motor Tone - Muscle tone was assessed at the neck and appendages and was normal.  Diminished fine finger movements on the left.  Orbits right over left upper extremity.  Trace left grip weakness.  Reflexes - The patient's reflexes were symmetrical in all extremities and he had no pathological reflexes.  Sensory - Light touch, temperature/pinprick were assessed and were symmetrical, .  Coordination - The patient had normal movements in the hands and feet with no ataxia or dysmetria.  Tremor was absent.  Gait and Station - deferred.   ASSESSMENT/PLAN Mr. Darel Ricketts is a 34 y.o. male with history of  congenital aortic stenosis and regurgitation status post aortic valve replacement x2 (at age 62 and then again in 2956) complicated by aortic dilation status post graft (2015 with valve replacement). He reports he was recommended to take aspirin for 6 weeks after his last surgery in 2015. Otherwise he has not been on any medications. He is to follow with Dr. Festus Holts at [facility] clinic (last seen in 2016 due to lapse in insurance)  presenting to [facility] ED with acute onset L sided weakness and evaluated by teleneurology and treated w/ tPA 08/12/2020 at 2059. Transferred to [facility] for post tPA care.    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 ASPECTS  10.     CTA head & neck proximal R M2 superior division occlusion w/ moderate collateralization.   CT perfusion 32mL R MCA core w/ 46mL penumbra  MRI - Acute/early subacute cortical infarct within the right insula, extending slightly into the right frontoparietal operculum. Additional tiny scattered acute/early subacute cortical and subcortical infarcts within the right frontoparietal operculum and more posteriorly within the right parietal lobe  MRA - Persistent apparent flow gap within a proximal M2 right MCA vessel, which may reflect segmental occlusion with distal reconstitution or severe segmental stenosis.  2D Echo EF 60 to 65%.  Severe LV hypertrophy, severe AV thickening  LE venous Doppler no DVT  Will do TEE on Monday -  if unyielding will need loop recorder  LDL 144  HgbA1c 4.6  Hypercoagulable pending  ANA pending  ESR and CRP neg  UDS neg  VTE prophylaxis - SCDs   No antithrombotic prior to admission, now on aspirin 325 mg daily and clopidogrel 75 mg daily   Therapy recommendations:  No PT or OT f/u  Disposition:  pending   Hyperlipidemia  Home meds:  No statin listed  LDL 144, goal < 70  Add Lipitor 40  Continue statin at discharge  Elevated TSH  TSH 9.55 (H)  Repeat TSH, free T4 in a.m. -> TSH 5.288 ; Free T4 0.88  Hx congenital aortic stenosis and regurgitation   status post aortic valve replacement x2 (at age 11 and then again in 2902) complicated by aortic dilation status post graft (2015 with valve replacement).   Only recommended to take aspirin for 6 weeks after his last surgery in 2015.   He is to follow with Dr. Festus Holts at [facility] clinic (last seen in 2016 due to lapse in insurance).  Recent moved to [place], yet to find cardiologist to follow-up   Other Stroke Risk Factors  Occasional ETOH use  Obesity, Body mass index is 33.68 kg/m., BMI >/= 30 associated with increased stroke risk, recommend weight loss, diet and  exercise as appropriate   Other Active Problems  Hypokalemia 3.4->3.3 -supplement - 3.8  Hospital day # 1  I had a long discussion the patient and his mother regarding his embolic right MCA infarct and discuss evaluation plan which will require checking a TEE and if there is no definite cardiac source identified may need loop recorder for paroxysmal A. fib.  We will follow hypercoagulable panel labs and vasculitic labs as well.  Mobilize out of bed.  Therapy consults.  Transfer to neurology floor bed if available.  Discussed with cardiology team.  Continue antibiotic therapy for now.  Greater than 50% time during this 35-minute visit was spent on counseling and coordination of care about his cryptogenic stroke and discussion about evaluation treatment plan and answering questions. Antony Contras, MD  To contact Stroke Continuity provider, please refer to [email or web address] After hours, contact General Neurology

## 2020-08-15 ENCOUNTER — Inpatient Hospital Stay (HOSPITAL_COMMUNITY): Payer: Self-pay

## 2020-08-15 DIAGNOSIS — I63511 Cerebral infarction due to unspecified occlusion or stenosis of right middle cerebral artery: Secondary | ICD-10-CM

## 2020-08-15 LAB — BETA-2-GLYCOPROTEIN I ABS, IGG/M/A
Beta-2 Glyco I IgG: 26 GPI IgG units — ABNORMAL HIGH (ref 0–20)
Beta-2-Glycoprotein I IgA: <9 GPI IgA units (ref 0–25)
Beta-2-Glycoprotein I IgM: <9 GPI IgM units (ref 0–32)

## 2020-08-15 NOTE — Progress Notes (Addendum)
STROKE TEAM PROGRESS NOTE   INTERVAL HISTORY  Patient states is doing fine.  Last night he had transient episode of tingling on the left face and little bit in the left hand and leg which lasted only few seconds and was not bothersome.  Vital signs are stable.  Neurological exam is unchanged.  TEE and loop recorder scheduled for tomorrow TCD bubble study was performed personally by me and was negative for PFO or right-to-left shunt Hypercoagulable labs are yet pending TSH is marginally elevated at 5.2 but free T4 is normal.  Clinically has no signs of hypothyroidism and will need these labs to be reported later as an outpatient Vitals:   08/14/20 2039 08/14/20 2308 08/15/20 0402 08/15/20 0833  BP: (!) 141/88 (!) 140/101 112/65 106/71  Pulse: 69 73 (!) 59 (!) 53  Resp: $Remo'18 18 18 19  'gSjzk$ Temp: 98.5 F (36.9 C) 98.8 F (37.1 C) 97.7 F (36.5 C) 98.1 F (36.7 C)  TempSrc: Oral Oral Oral Oral  SpO2: 99% 97% 97% 98%  Weight:      Height:       CBC:  Recent Labs  Lab 08/12/20 2003 08/12/20 2003 08/12/20 2036 08/13/20 0425  WBC 5.9  --   --  7.0  NEUTROABS 4.0  --   --  4.0  HGB 14.9   < > 13.9 14.9  HCT 42.5   < > 41.0 41.2  MCV 88.5  --   --  85.8  PLT 167  --   --  169   < > = values in this interval not displayed.   Basic Metabolic Panel:  Recent Labs  Lab 08/13/20 0425 08/14/20 0106  NA 140 140  K 3.3* 3.8  CL 105 105  CO2 24 25  GLUCOSE 87 83  BUN 12 10  CREATININE 1.06 1.03  CALCIUM 9.2 9.6   Lipid Panel:  Recent Labs  Lab 08/13/20 0425  CHOL 189  TRIG 101  HDL 25*  CHOLHDL 7.6  VLDL 20  LDLCALC 144*   HgbA1c:  Recent Labs  Lab 08/13/20 0425  HGBA1C 4.6*   Urine Drug Screen:  Recent Labs  Lab 08/13/20 1342  LABOPIA NONE DETECTED  COCAINSCRNUR NONE DETECTED  LABBENZ NONE DETECTED  AMPHETMU NONE DETECTED  THCU NONE DETECTED  LABBARB NONE DETECTED    Alcohol Level  Recent Labs  Lab 08/12/20 2027  ETH <10    IMAGING past 24 hours VAS [place]  TRANSCRANIAL DOPPLER W BUBBLES  Result Date: 08/15/2020  Transcranial Doppler with Bubble Indications: Stroke. History: Left sided parasthesia, left facial droop. Bicuspid aortic valve with aortic valve replacement (bovine) 2006, aortic valve replacement (bioprosthetic) 2015. Comparison Study: No prior study Performing Technologist: Maudry Mayhew MHA, RDMS, RVT, RDCS  Examination Guidelines: A complete evaluation includes B-mode imaging, spectral Doppler, color Doppler, and power Doppler as needed of all accessible portions of each vessel. Bilateral testing is considered an integral part of a complete examination. Limited examinations for reoccurring indications may be performed as noted.  Summary: No HITS at rest or during Valsalva. Negative transcranial Doppler Bubble study with no evidence of right to left intracardiac communication.  A vascular evaluation was performed. The right middle cerebral artery was studied. An IV was inserted into the patient's left Forearm. Verbal informed consent was obtained.  *See table(s) above for TCD measurements and observations.    Preliminary     PHYSICAL EXAM     Temp:  [97.7 F (36.5 C)-98.8 F (37.1 C)]  98.1 F (36.7 C) (12/05 0833) Pulse Rate:  [53-83] 53 (12/05 0833) Resp:  [13-19] 19 (12/05 0833) BP: (106-151)/(65-101) 106/71 (12/05 0833) SpO2:  [97 %-99 %] 98 % (12/05 0833)  General - Well nourished, well developed young Caucasian male, in no apparent distress.  Ophthalmologic - fundi not visualized due to noncooperation.  Cardiovascular - Regular rhythm and rate.  Midline surgical scar from prior cardiac surgery  Mental Status -  Level of arousal and orientation to time, place, and person were intact. Language including expression, naming, repetition, comprehension was assessed and found intact. Attention span and concentration were normal. Fund of Knowledge was assessed and was intact.  Cranial Nerves II - XII - II - Visual field  intact OU. III, IV, VI - Extraocular movements intact. V - Facial sensation intact bilaterally. VII - Facial movement intact bilaterally. VIII - Hearing & vestibular intact bilaterally. X - Palate elevates symmetrically. XI - Chin turning & shoulder shrug intact bilaterally. XII - Tongue protrusion intact.  Motor Strength - The patient's strength was normal in all extremities and pronator drift was absent.  Bulk was normal and fasciculations were absent.   Motor Tone - Muscle tone was assessed at the neck and appendages and was normal.  Diminished fine finger movements on the left.  Orbits right over left upper extremity.  Trace left grip weakness.  Reflexes - The patient's reflexes were symmetrical in all extremities and he had no pathological reflexes.  Sensory - Light touch, temperature/pinprick were assessed and were symmetrical, .  Coordination - The patient had normal movements in the hands and feet with no ataxia or dysmetria.  Tremor was absent.  Gait and Station - deferred.   ASSESSMENT/PLAN [name] is a 34 y.o. male with history of  congenital aortic stenosis and regurgitation status post aortic valve replacement x2 (at age 28 and then again in 5784) complicated by aortic dilation status post graft (2015 with valve replacement). He reports he was recommended to take aspirin for 6 weeks after his last surgery in 2015. Otherwise he has not been on any medications. He is to follow with Dr. Festus Holts at [facility] clinic (last seen in 2016 due to lapse in insurance)  presenting to [facility] ED with acute onset L sided weakness and evaluated by teleneurology and treated w/ tPA 08/12/2020 at 2059. Transferred to [facility] for post tPA care.    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 ASPECTS  10.     CTA head & neck proximal R M2 superior division occlusion w/ moderate collateralization.   CT perfusion 28mL R MCA core w/ 72mL penumbra  MRI - Acute/early subacute cortical infarct within the right insula, extending slightly into the right frontoparietal operculum. Additional tiny scattered acute/early subacute cortical and subcortical infarcts within the right frontoparietal operculum and more posteriorly within the right parietal lobe  MRA - Persistent apparent flow gap within a proximal M2 right MCA vessel, which may reflect segmental occlusion with distal reconstitution or severe segmental stenosis.  2D Echo EF 60 to 65%.  Severe LV hypertrophy, severe AV thickening  LE venous Doppler no DVT  Will do TEE on Monday - if unyielding will need loop recorder  LDL 144  HgbA1c 4.6  Hypercoagulable pending  ANA pending  ESR and CRP neg  UDS  neg  VTE prophylaxis - SCDs   No antithrombotic prior to admission, now on aspirin 325 mg daily and clopidogrel 75 mg daily   Therapy recommendations:  No PT or OT f/u  Disposition:  pending   Hyperlipidemia  Home meds:  No statin listed  LDL 144, goal < 70  Add Lipitor 40  Continue statin at discharge  Elevated TSH  TSH 9.55 (H)  Repeat TSH, free T4 in a.m. -> TSH 5.288 ; Free T4 0.88  Hx congenital aortic stenosis and regurgitation   status post aortic valve replacement x2 (at age 29 and then again in 1740) complicated by aortic dilation status post graft (2015 with valve replacement).   Only recommended to take aspirin for 6 weeks after his last surgery in 2015.   He is to follow with Dr. Festus Holts at [facility] clinic (last seen in 2016 due to lapse in insurance).  Recent moved to [place], yet to find cardiologist to follow-up   Other Stroke Risk Factors  Occasional ETOH use  Obesity, Body mass index is 33.68 kg/m., BMI >/= 30 associated with increased stroke risk, recommend weight loss, diet and  exercise as appropriate   Other Active Problems  Hypokalemia 3.4->3.3 -supplement - 3.8  Ordered NPO after midnight for TEE tomorrow - will need loop if unrevealing and if pt willing to proceed.  Mildly elevated TSH but normal free T4 unclear significance will need to repeat as an outpatient  Hospital day # 2 Check TEE tomorrow and if no cardiac source of embolism will need loop recorder.  Follow hypercoagulable labs.  Likely discharge home tomorrow and follow-up as an outpatient stroke clinic in 6 weeks.  Long discussion with patient and mother and answered questions.  Greater than 50% time during this 25-minute visit was spent in counseling and coordination of care about embolic stroke and discussion about stroke work-up and answering questions. Antony Contras, MD   To contact Stroke Continuity provider, please refer to [email or web address] After hours, contact General Neurology

## 2020-08-15 NOTE — Progress Notes (Signed)
Pt had a quiet night rest, verbalized at about 2040 that he had a transient episode of shocking sensation on his left arm,face and leg that resolved immediately, pt however denies any discomfort thereafter, was however reassured and continued to monitor, v/s stable. Obasogie-Asidi, Jordyn Doane Efe

## 2020-08-15 NOTE — TOC Initial Note (Signed)
Transition of Care [facility]) - Initial/Assessment Note    Patient Details  Name: Anthony Buckley MRN: [id number] Date of Birth: August 01, 1986  [facility] CM/SW Contact:    Carley Hammed, LCSWA Phone Number: 08/15/2020, 4:15 PM  Clinical Narrative:                 Pt's nurse notified CSW that pt would like to speak with a Child psychotherapist. Pt noted that he does not have Insurance and would like to speak with financial counseling. Weekday SW please consult on Monday.  Expected Discharge Plan:  (Home with no follow up) Barriers to Discharge: Continued Medical Work up   Patient Goals and CMS Choice Patient states their goals for this hospitalization and ongoing recovery are:: Pt requesting consult with Financial counseling      Expected Discharge Plan and Services Expected Discharge Plan:  (Home with no follow up)                                              Prior Living Arrangements/Services     Patient language and need for interpreter reviewed:: Yes        Need for Family Participation in Patient Care: No (Comment) Care giver support system in place?: No (comment)   Criminal Activity/Legal Involvement Pertinent to Current Situation/Hospitalization: No - Comment as needed  Activities of Daily Living Home Assistive Devices/Equipment: None ADL Screening (condition at time of admission) Patient's cognitive ability adequate to safely complete daily activities?: Yes Is the patient deaf or have difficulty hearing?: No Does the patient have difficulty seeing, even when wearing glasses/contacts?: No Does the patient have difficulty concentrating, remembering, or making decisions?: No Patient able to express need for assistance with ADLs?: No Does the patient have difficulty dressing or bathing?: No Independently performs ADLs?: Yes (appropriate for developmental age) Does the patient have difficulty walking or climbing stairs?: No  Permission Sought/Granted                   Emotional Assessment Appearance:: Appears stated age Attitude/Demeanor/Rapport: Engaged Affect (typically observed): Pleasant Orientation: : Oriented to Self, Oriented to Place, Oriented to  Time, Oriented to Situation Alcohol / Substance Use: Not Applicable Psych Involvement: No (comment)  Admission diagnosis:  Acute ischemic right MCA stroke (HCC) [I63.511] Acute ischemic stroke [facility]) [I63.9] Patient Active Problem List   Diagnosis Date Noted  . Acute ischemic right MCA stroke (HCC) 08/13/2020   PCP:  Patient, No Pcp Per Pharmacy:   [id number] - [place], [place] - [address] AT [facility] OF [address] [place] [address] Phone: [phone] Fax: [phone]     Social Determinants of Health (SDOH) Interventions    Readmission Risk Interventions No flowsheet data found.

## 2020-08-15 NOTE — Progress Notes (Signed)
TCD bubble study completed with Dr. Pearlean Brownie. Refer to "CV Proc" under chart review to view preliminary results.  08/15/2020 11:47 AM Eula Fried., MHA, RVT, RDCS, RDMS

## 2020-08-16 ENCOUNTER — Encounter (HOSPITAL_COMMUNITY)
Admission: EM | Disposition: A | Discharge status: Home / Self Care | Payer: Self-pay | Source: Home / Self Care | Attending: Neurology

## 2020-08-16 ENCOUNTER — Inpatient Hospital Stay (HOSPITAL_COMMUNITY): Payer: Self-pay

## 2020-08-16 ENCOUNTER — Encounter (HOSPITAL_COMMUNITY): Payer: Self-pay | Admitting: Cardiology

## 2020-08-16 ENCOUNTER — Inpatient Hospital Stay (HOSPITAL_COMMUNITY): Payer: Self-pay | Admitting: Certified Registered"

## 2020-08-16 DIAGNOSIS — I639 Cerebral infarction, unspecified: Secondary | ICD-10-CM

## 2020-08-16 DIAGNOSIS — E785 Hyperlipidemia, unspecified: Secondary | ICD-10-CM | POA: Diagnosis present

## 2020-08-16 DIAGNOSIS — E669 Obesity, unspecified: Secondary | ICD-10-CM | POA: Diagnosis present

## 2020-08-16 DIAGNOSIS — R7989 Other specified abnormal findings of blood chemistry: Secondary | ICD-10-CM | POA: Diagnosis present

## 2020-08-16 DIAGNOSIS — Q230 Congenital stenosis of aortic valve: Secondary | ICD-10-CM

## 2020-08-16 HISTORY — PX: TEE WITHOUT CARDIOVERSION: SHX5443

## 2020-08-16 HISTORY — PX: BUBBLE STUDY: SHX6837

## 2020-08-16 LAB — LUPUS ANTICOAGULANT PANEL
DRVVT: 38.3 s (ref 0.0–47.0)
PTT Lupus Anticoagulant: 34.3 s (ref 0.0–51.9)

## 2020-08-16 LAB — HOMOCYSTEINE: Homocysteine: 13.6 umol/L (ref 0.0–14.5)

## 2020-08-16 LAB — ANTINUCLEAR ANTIBODIES, IFA: ANA Ab, IFA: NEGATIVE

## 2020-08-16 SURGERY — ECHOCARDIOGRAM, TRANSESOPHAGEAL
Anesthesia: Monitor Anesthesia Care

## 2020-08-16 MED ORDER — PROPOFOL 500 MG/50ML IV EMUL
INTRAVENOUS | Status: DC | PRN
  Administered 2020-08-16: 125 ug/kg/min via INTRAVENOUS

## 2020-08-16 MED ORDER — ASPIRIN 81 MG PO TBEC: 81.0000 mg | DELAYED_RELEASE_TABLET | Freq: Every day | ORAL | 11 refills | Status: AC

## 2020-08-16 MED ORDER — SODIUM CHLORIDE 0.9 % IV SOLN: INTRAVENOUS | Status: DC

## 2020-08-16 MED ORDER — LACTATED RINGERS IV SOLN: INTRAVENOUS | Status: DC

## 2020-08-16 MED ORDER — CLOPIDOGREL BISULFATE 75 MG PO TABS: 75.0000 mg | ORAL_TABLET | Freq: Every day | ORAL | 0 refills | Status: AC

## 2020-08-16 MED ORDER — ATORVASTATIN CALCIUM 40 MG PO TABS: 40.0000 mg | ORAL_TABLET | Freq: Every day | ORAL | 2 refills | Status: DC

## 2020-08-16 MED ORDER — MIDAZOLAM HCL 5 MG/5ML IJ SOLN
INTRAMUSCULAR | Status: DC | PRN
  Administered 2020-08-16: 2 mg via INTRAVENOUS

## 2020-08-16 NOTE — Discharge Summary (Addendum)
Stroke Discharge Summary  Patient ID: Anthony Buckley   MRN: [id number]      DOB: 1986/01/17  Date of Admission: 08/12/2020 Date of Discharge: 08/16/2020  Attending Physician:  Garvin Fila, MD, Stroke MD Consultant(s):    None  Patient's PCP:  Patient, No Pcp Per  DISCHARGE DIAGNOSIS:  Principal Problem:   Acute ischemic right MCA stroke [facility]) s/p tPA, embolic source unk Active Problems:   Hyperlipidemia LDL goal <70   TSH elevation   Congenital aortic stenosis s/p replacement x 2   Obesity   Allergies as of 08/16/2020       Reactions   Amoxicillin Rash        Medication List     TAKE these medications    aspirin 81 MG EC tablet Take 1 tablet (81 mg total) by mouth daily. Swallow whole. Start taking on: August 17, 2020   atorvastatin 40 MG tablet Commonly known as: LIPITOR Take 1 tablet (40 mg total) by mouth daily. Start taking on: August 17, 2020   clopidogrel 75 MG tablet Commonly known as: PLAVIX Take 1 tablet (75 mg total) by mouth daily for 21 days. Start taking on: August 17, 2020        LABORATORY STUDIES CBC    Component Value Date/Time   WBC 7.0 08/13/2020 0425   RBC 4.80 08/13/2020 0425   HGB 14.9 08/13/2020 0425   HCT 41.2 08/13/2020 0425   PLT 169 08/13/2020 0425   MCV 85.8 08/13/2020 0425   MCH 31.0 08/13/2020 0425   MCHC 36.2 (H) 08/13/2020 0425   RDW 11.9 08/13/2020 0425   LYMPHSABS 2.3 08/13/2020 0425   MONOABS 0.6 08/13/2020 0425   EOSABS 0.0 08/13/2020 0425   BASOSABS 0.0 08/13/2020 0425   CMP    Component Value Date/Time   NA 140 08/14/2020 0106   K 3.8 08/14/2020 0106   CL 105 08/14/2020 0106   CO2 25 08/14/2020 0106   GLUCOSE 83 08/14/2020 0106   BUN 10 08/14/2020 0106   CREATININE 1.03 08/14/2020 0106   CALCIUM 9.6 08/14/2020 0106   PROT 6.1 (L) 08/13/2020 0425   ALBUMIN 3.8 08/13/2020 0425   AST 25 08/13/2020 0425   ALT 59 (H) 08/13/2020 0425   ALKPHOS 44 08/13/2020 0425   BILITOT 0.8 08/13/2020 0425    GFRNONAA >60 08/14/2020 0106   COAGS Lab Results  Component Value Date   INR 1.1 08/12/2020   Lipid Panel    Component Value Date/Time   CHOL 189 08/13/2020 0425   TRIG 101 08/13/2020 0425   HDL 25 (L) 08/13/2020 0425   CHOLHDL 7.6 08/13/2020 0425   VLDL 20 08/13/2020 0425   LDLCALC 144 (H) 08/13/2020 0425   HgbA1C  Lab Results  Component Value Date   HGBA1C 4.6 (L) 08/13/2020   Urine Drug Screen     Component Value Date/Time   LABOPIA NONE DETECTED 08/13/2020 1342   COCAINSCRNUR NONE DETECTED 08/13/2020 1342   LABBENZ NONE DETECTED 08/13/2020 1342   AMPHETMU NONE DETECTED 08/13/2020 1342   THCU NONE DETECTED 08/13/2020 1342   LABBARB NONE DETECTED 08/13/2020 1342    Alcohol Level    Component Value Date/Time   ETH <10 08/12/2020 2027   SIGNIFICANT DIAGNOSTIC STUDIES CT Angio Head W or Wo Contrast  Result Date: 08/12/2020 CLINICAL DATA:  Left-sided numbness EXAM: CT ANGIOGRAPHY HEAD AND NECK TECHNIQUE: Multidetector CT imaging of the head and neck was performed using the standard protocol during bolus administration  of intravenous contrast. Multiplanar CT image reconstructions and MIPs were obtained to evaluate the vascular anatomy. Carotid stenosis measurements (when applicable) are obtained utilizing NASCET criteria, using the distal internal carotid diameter as the denominator. CONTRAST:  47mL OMNIPAQUE IOHEXOL 350 MG/ML SOLN COMPARISON:  None. FINDINGS: CTA NECK FINDINGS SKELETON: There is no bony spinal canal stenosis. No lytic or blastic lesion. OTHER NECK: Normal pharynx, larynx and major salivary glands. No cervical lymphadenopathy. Unremarkable thyroid gland. UPPER CHEST: No pneumothorax or pleural effusion. No nodules or masses. AORTIC ARCH: There is no calcific atherosclerosis of the aortic arch. There is no aneurysm, dissection or hemodynamically significant stenosis of the visualized portion of the aorta. Conventional 3 vessel aortic branching pattern. The  visualized proximal subclavian arteries are widely patent. RIGHT CAROTID SYSTEM: Normal without aneurysm, dissection or stenosis. LEFT CAROTID SYSTEM: Normal without aneurysm, dissection or stenosis. VERTEBRAL ARTERIES: Left dominant configuration. Both origins are clearly patent. There is no dissection, occlusion or flow-limiting stenosis to the skull base (V1-V3 segments). CTA HEAD FINDINGS POSTERIOR CIRCULATION: --Vertebral arteries: Normal V4 segments. --Inferior cerebellar arteries: Normal. --Basilar artery: Normal. --Superior cerebellar arteries: Normal. --Posterior cerebral arteries (PCA): Normal. ANTERIOR CIRCULATION: --Intracranial internal carotid arteries: Normal. --Anterior cerebral arteries (ACA): Normal. Both A1 segments are present. Patent anterior communicating artery (a-comm). --Middle cerebral arteries (MCA): There is occlusion the proximal right M2 superior division (series 7 images 266-269). Moderate collateralization. Left MCA is normal. VENOUS SINUSES: As permitted by contrast timing, patent. ANATOMIC VARIANTS: None Review of the MIP images confirms the above findings. IMPRESSION: 1. Occlusion of the proximal right M2 superior division with moderate collateralization. The location is in keeping with the patient's symptoms. 2. No other intracranial arterial occlusion or high-grade stenosis. 3. Normal cervical carotid and vertebral arteries. Critical Value/emergent results were called by telephone at the time of interpretation on 08/12/2020 at 8:32 pm to provider [facility] , who verbally acknowledged these results. Electronically Signed   By: Ulyses Jarred M.D.   On: 08/12/2020 20:35   CT Angio Neck W and/or Wo Contrast  Result Date: 08/12/2020 CLINICAL DATA:  Left-sided numbness EXAM: CT ANGIOGRAPHY HEAD AND NECK TECHNIQUE: Multidetector CT imaging of the head and neck was performed using the standard protocol during bolus administration of intravenous contrast. Multiplanar CT image  reconstructions and MIPs were obtained to evaluate the vascular anatomy. Carotid stenosis measurements (when applicable) are obtained utilizing NASCET criteria, using the distal internal carotid diameter as the denominator. CONTRAST:  58mL OMNIPAQUE IOHEXOL 350 MG/ML SOLN COMPARISON:  None. FINDINGS: CTA NECK FINDINGS SKELETON: There is no bony spinal canal stenosis. No lytic or blastic lesion. OTHER NECK: Normal pharynx, larynx and major salivary glands. No cervical lymphadenopathy. Unremarkable thyroid gland. UPPER CHEST: No pneumothorax or pleural effusion. No nodules or masses. AORTIC ARCH: There is no calcific atherosclerosis of the aortic arch. There is no aneurysm, dissection or hemodynamically significant stenosis of the visualized portion of the aorta. Conventional 3 vessel aortic branching pattern. The visualized proximal subclavian arteries are widely patent. RIGHT CAROTID SYSTEM: Normal without aneurysm, dissection or stenosis. LEFT CAROTID SYSTEM: Normal without aneurysm, dissection or stenosis. VERTEBRAL ARTERIES: Left dominant configuration. Both origins are clearly patent. There is no dissection, occlusion or flow-limiting stenosis to the skull base (V1-V3 segments). CTA HEAD FINDINGS POSTERIOR CIRCULATION: --Vertebral arteries: Normal V4 segments. --Inferior cerebellar arteries: Normal. --Basilar artery: Normal. --Superior cerebellar arteries: Normal. --Posterior cerebral arteries (PCA): Normal. ANTERIOR CIRCULATION: --Intracranial internal carotid arteries: Normal. --Anterior cerebral arteries (ACA): Normal. Both A1 segments are  present. Patent anterior communicating artery (a-comm). --Middle cerebral arteries (MCA): There is occlusion the proximal right M2 superior division (series 7 images 266-269). Moderate collateralization. Left MCA is normal. VENOUS SINUSES: As permitted by contrast timing, patent. ANATOMIC VARIANTS: None Review of the MIP images confirms the above findings. IMPRESSION: 1.  Occlusion of the proximal right M2 superior division with moderate collateralization. The location is in keeping with the patient's symptoms. 2. No other intracranial arterial occlusion or high-grade stenosis. 3. Normal cervical carotid and vertebral arteries. Critical Value/emergent results were called by telephone at the time of interpretation on 08/12/2020 at 8:32 pm to provider [facility] , who verbally acknowledged these results. Electronically Signed   By: Ulyses Jarred M.D.   On: 08/12/2020 20:35   MR ANGIO HEAD WO CONTRAST  Result Date: 08/13/2020 CLINICAL DATA:  Stroke, follow-up. EXAM: MRI HEAD WITHOUT CONTRAST MRA HEAD WITHOUT CONTRAST TECHNIQUE: Multiplanar, multiecho pulse sequences of the brain and surrounding structures were obtained without intravenous contrast. Angiographic images of the head were obtained using MRA technique without contrast. COMPARISON:  Noncontrast head CT, CT angiogram head/neck and CT perfusion 08/12/2020. FINDINGS: MRI HEAD FINDINGS Brain: Cerebral volume is normal. Restricted diffusion within the mid and posterior right insula, extending slightly into the adjacent right frontoparietal operculum. Findings are compatible with acute/early subacute infarction. Additional scattered tiny acute/early subacute cortical/subcortical infarcts within the right frontoparietal operculum and more posteriorly within the right parietal lobe. Corresponding T2/FLAIR hyperintensity at these sites. There are a few scattered supratentorial chronic microhemorrhages, nonspecific. No evidence of intracranial mass. No extra-axial fluid collection. No midline shift. Vascular: Reported below. Skull and upper cervical spine: No focal marrow lesion Sinuses/Orbits: Visualized orbits show no acute finding. Trace ethmoid sinus mucosal thickening. Small left maxillary sinus mucous retention cysts MRA HEAD FINDINGS The intracranial internal carotid arteries are patent. The M1 middle cerebral arteries are  patent. Persistent apparent flow gap within a proximal M2 right MCA vessel which may reflect segmental occlusion with distal reconstitution or severe segmental stenosis (series 1034, image 16). Robust flow related signal more distally within this vessel. No M2 proximal branch occlusion or high-grade proximal stenosis identified elsewhere. The anterior cerebral arteries are patent. The intracranial vertebral arteries are patent. The basilar artery is patent. The posterior cerebral arteries are patent. No intracranial aneurysm is identified. IMPRESSION: MRI brain: 1. Acute/early subacute cortical infarct within the right insula, extending slightly into the right frontoparietal operculum. Additional tiny scattered acute/early subacute cortical and subcortical infarcts within the right frontoparietal operculum and more posteriorly within the right parietal lobe. No mass effect. No evidence of hemorrhagic conversion. 2. A few scattered supratentorial chronic microhemorrhages are nonspecific. MRA head: Persistent apparent flow gap within a proximal M2 right MCA vessel, which may reflect segmental occlusion with distal reconstitution or severe segmental stenosis. Electronically Signed   By: Kellie Simmering DO   On: 08/13/2020 22:23   MR BRAIN WO CONTRAST  Result Date: 08/13/2020 CLINICAL DATA:  Stroke, follow-up. EXAM: MRI HEAD WITHOUT CONTRAST MRA HEAD WITHOUT CONTRAST TECHNIQUE: Multiplanar, multiecho pulse sequences of the brain and surrounding structures were obtained without intravenous contrast. Angiographic images of the head were obtained using MRA technique without contrast. COMPARISON:  Noncontrast head CT, CT angiogram head/neck and CT perfusion 08/12/2020. FINDINGS: MRI HEAD FINDINGS Brain: Cerebral volume is normal. Restricted diffusion within the mid and posterior right insula, extending slightly into the adjacent right frontoparietal operculum. Findings are compatible with acute/early subacute infarction.  Additional scattered tiny acute/early subacute cortical/subcortical infarcts  within the right frontoparietal operculum and more posteriorly within the right parietal lobe. Corresponding T2/FLAIR hyperintensity at these sites. There are a few scattered supratentorial chronic microhemorrhages, nonspecific. No evidence of intracranial mass. No extra-axial fluid collection. No midline shift. Vascular: Reported below. Skull and upper cervical spine: No focal marrow lesion Sinuses/Orbits: Visualized orbits show no acute finding. Trace ethmoid sinus mucosal thickening. Small left maxillary sinus mucous retention cysts MRA HEAD FINDINGS The intracranial internal carotid arteries are patent. The M1 middle cerebral arteries are patent. Persistent apparent flow gap within a proximal M2 right MCA vessel which may reflect segmental occlusion with distal reconstitution or severe segmental stenosis (series 1034, image 16). Robust flow related signal more distally within this vessel. No M2 proximal branch occlusion or high-grade proximal stenosis identified elsewhere. The anterior cerebral arteries are patent. The intracranial vertebral arteries are patent. The basilar artery is patent. The posterior cerebral arteries are patent. No intracranial aneurysm is identified. IMPRESSION: MRI brain: 1. Acute/early subacute cortical infarct within the right insula, extending slightly into the right frontoparietal operculum. Additional tiny scattered acute/early subacute cortical and subcortical infarcts within the right frontoparietal operculum and more posteriorly within the right parietal lobe. No mass effect. No evidence of hemorrhagic conversion. 2. A few scattered supratentorial chronic microhemorrhages are nonspecific. MRA head: Persistent apparent flow gap within a proximal M2 right MCA vessel, which may reflect segmental occlusion with distal reconstitution or severe segmental stenosis. Electronically Signed   By: Kellie Simmering DO    On: 08/13/2020 22:23   CT CEREBRAL PERFUSION W CONTRAST  Result Date: 08/12/2020 CLINICAL DATA:  Right MCA stroke EXAM: CT PERFUSION BRAIN TECHNIQUE: Multiphase CT imaging of the brain was performed following IV bolus contrast injection. Subsequent parametric perfusion maps were calculated using RAPID software. CONTRAST:  58mL OMNIPAQUE IOHEXOL 350 MG/ML SOLN COMPARISON:  None. FINDINGS: CT Brain Perfusion Findings: CBF (<30%) Volume: 5mL Perfusion (Tmax>6.0s) volume: 37mL Mismatch Volume: 96mL ASPECTS on noncontrast CT Head: 10 at 8:21 p.m. today. Infarct Core: 11 mL Infarction Location:Posterosuperior right MCA territory IMPRESSION: 11 mL core infarct in the right MCA territory with 49 mL surrounding ischemic penumbra. Electronically Signed   By: Ulyses Jarred M.D.   On: 08/12/2020 21:41   VAS [place] TRANSCRANIAL DOPPLER W BUBBLES  Result Date: 08/15/2020  Transcranial Doppler with Bubble Indications: Stroke. History: Left sided parasthesia, left facial droop. Bicuspid aortic valve with aortic valve replacement (bovine) 2006, aortic valve replacement (bioprosthetic) 2015. Comparison Study: No prior study Performing Technologist: Maudry Mayhew MHA, RDMS, RVT, RDCS  Examination Guidelines: A complete evaluation includes B-mode imaging, spectral Doppler, color Doppler, and power Doppler as needed of all accessible portions of each vessel. Bilateral testing is considered an integral part of a complete examination. Limited examinations for reoccurring indications may be performed as noted.  Summary: No HITS at rest or during Valsalva. Negative transcranial Doppler Bubble study with no evidence of right to left intracardiac communication.  A vascular evaluation was performed. The right middle cerebral artery was studied. An IV was inserted into the patient's left Forearm. Verbal informed consent was obtained.  Negative TCD Bubble study *See table(s) above for TCD measurements and observations.  Diagnosing  physician: Antony Contras MD Electronically signed by Antony Contras MD on 08/15/2020 at 12:40:14 PM.    Final    ECHOCARDIOGRAM COMPLETE  Result Date: 08/13/2020    ECHOCARDIOGRAM REPORT   Patient Name:   Anthony Buckley Date of Exam: 08/13/2020 Medical Rec #:  [id number]  Height:       71.0 in Accession #:    [id number]     Weight:       241.5 lb Date of Birth:  [birth year]      BSA:          2.284 m Patient Age:    34 years       BP:           156/71 mmHg Patient Gender: M              HR:           62 bpm. Exam Location:  Inpatient Procedure: 2D Echo, Cardiac Doppler and Color Doppler Indications:    Stroke  History:        Patient has no prior history of Echocardiogram examinations.                 Aortic Valve Disease. Congenital aortic stenosis and                 regurgitation status post aortic valve replacement x2 (at age 81                 and then again in 9326) complicated by aortic dilation status                 post graft (2015 with valve replacement). Patient unaware of AVR                 size. Has followed with Dr. Macarthur Critchley at [facility].  Sonographer:    Clayton Lefort RDCS (AE) Referring Phys: [id number] [place]  1. Left ventricular ejection fraction, by estimation, is 60 to 65%. The left ventricle has normal function. The left ventricle has no regional wall motion abnormalities. There is severe left ventricular hypertrophy. Left ventricular diastolic parameters  are indeterminate.  2. Right ventricular systolic function is normal. The right ventricular size is normal. There is normal pulmonary artery systolic pressure.  3. Right atrial size was mildly dilated.  4. The mitral valve is normal in structure. No evidence of mitral valve regurgitation.  5. This aortic valve gradient is likely normla for this valve. No prior echo for comparison. . The aortic valve has been repaired/replaced. There is moderate calcification of the aortic valve. There is severe thickening of  the aortic valve. Aortic valve  regurgitation is trivial.  6. S/p aortic root replacement . Aortic root/ascending aorta has been repaired/replaced. FINDINGS  Left Ventricle: Left ventricular ejection fraction, by estimation, is 60 to 65%. The left ventricle has normal function. The left ventricle has no regional wall motion abnormalities. The left ventricular internal cavity size was normal in size. There is  severe left ventricular hypertrophy. Left ventricular diastolic parameters are indeterminate. Right Ventricle: The right ventricular size is normal. No increase in right ventricular wall thickness. Right ventricular systolic function is normal. There is normal pulmonary artery systolic pressure. The tricuspid regurgitant velocity is 2.28 m/s, and  with an assumed right atrial pressure of 3 mmHg, the estimated right ventricular systolic pressure is 99.8 mmHg. Left Atrium: Left atrial size was normal in size. Right Atrium: Right atrial size was mildly dilated. Pericardium: There is no evidence of pericardial effusion. Mitral Valve: The mitral valve is normal in structure. No evidence of mitral valve regurgitation. MV peak gradient, 7.2 mmHg. The mean mitral valve gradient is 2.0 mmHg. Tricuspid Valve: The tricuspid valve is normal in structure. Tricuspid valve regurgitation is mild.  Aortic Valve: This aortic valve gradient is likely normla for this valve. No prior echo for comparison. The aortic valve has been repaired/replaced. There is moderate calcification of the aortic valve. There is severe thickening of the aortic valve. There is mild aortic valve annular calcification. Aortic valve regurgitation is trivial. Aortic regurgitation PHT measures 639 msec. Aortic valve mean gradient measures 11.3 mmHg. Aortic valve peak gradient measures 20.7 mmHg. Aortic valve area, by VTI measures 2.23 cm. Pulmonic Valve: The pulmonic valve was normal in structure. Pulmonic valve regurgitation is not visualized. Aorta: S/p  aortic root replacement. The aortic root/ascending aorta has been repaired/replaced. IAS/Shunts: The atrial septum is grossly normal.  LEFT VENTRICLE PLAX 2D LVIDd:         4.60 cm  Diastology LVIDs:         3.10 cm  LV e' medial:    7.18 cm/s LV PW:         1.70 cm  LV E/e' medial:  17.4 LV IVS:        1.40 cm  LV e' lateral:   9.57 cm/s LVOT diam:     2.20 cm  LV E/e' lateral: 13.1 LV SV:         109 LV SV Index:   48 LVOT Area:     3.80 cm  RIGHT VENTRICLE            IVC RV Basal diam:  3.30 cm    IVC diam: 1.70 cm RV S prime:     9.90 cm/s TAPSE (M-mode): 2.3 cm LEFT ATRIUM             Index       RIGHT ATRIUM           Index LA diam:        3.30 cm 1.44 cm/m  RA Area:     20.70 cm LA Vol (A2C):   53.6 ml 23.46 ml/m RA Volume:   61.30 ml  26.83 ml/m LA Vol (A4C):   58.0 ml 25.39 ml/m LA Biplane Vol: 56.1 ml 24.56 ml/m  AORTIC VALVE AV Area (Vmax):    2.14 cm AV Area (Vmean):   2.04 cm AV Area (VTI):     2.23 cm AV Vmax:           227.33 cm/s AV Vmean:          158.667 cm/s AV VTI:            0.487 m AV Peak Grad:      20.7 mmHg AV Mean Grad:      11.3 mmHg LVOT Vmax:         128.00 cm/s LVOT Vmean:        85.300 cm/s LVOT VTI:          0.286 m LVOT/AV VTI ratio: 0.59 AI PHT:            639 msec  AORTA Ao Root diam: 3.50 cm MITRAL VALVE                TRICUSPID VALVE MV Area (PHT): 3.17 cm     TR Peak grad:   20.8 mmHg MV Peak grad:  7.2 mmHg     TR Vmax:        228.00 cm/s MV Mean grad:  2.0 mmHg MV Vmax:       1.34 m/s     SHUNTS MV Vmean:      71.0 cm/s    Systemic VTI:  0.29 m MV Decel Time: 239 msec     Systemic Diam: 2.20 cm MV E velocity: 125.00 cm/s MV A velocity: 62.60 cm/s MV E/A ratio:  2.00 Mertie Moores MD Electronically signed by Mertie Moores MD Signature Date/Time: 08/13/2020/2:49:42 PM    Final    CT HEAD CODE STROKE WO CONTRAST  Result Date: 08/12/2020 CLINICAL DATA:  Code stroke.  Left-sided numbness EXAM: CT HEAD WITHOUT CONTRAST TECHNIQUE: Contiguous axial images were obtained  from the base of the skull through the vertex without intravenous contrast. COMPARISON:  None. FINDINGS: Brain: There is no mass, hemorrhage or extra-axial collection. The size and configuration of the ventricles and extra-axial CSF spaces are normal. The brain parenchyma is normal, without evidence of acute or chronic infarction. Vascular: No abnormal hyperdensity of the major intracranial arteries or dural venous sinuses. No intracranial atherosclerosis. Skull: The visualized skull base, calvarium and extracranial soft tissues are normal. Sinuses/Orbits: No fluid levels or advanced mucosal thickening of the visualized paranasal sinuses. No mastoid or middle ear effusion. The orbits are normal. ASPECTS [facility] Stroke Program Early CT Score) - Ganglionic level infarction (caudate, lentiform nuclei, internal capsule, insula, M1-M3 cortex): 7 - Supraganglionic infarction (M4-M6 cortex): 3 Total score (0-10 with 10 being normal): 10 IMPRESSION: 1. Normal head CT. 2. ASPECTS is 10. These results were called by telephone at the time of interpretation on 08/12/2020 at 8:21 pm to provider [facility] , who verbally acknowledged these results. Electronically Signed   By: Ulyses Jarred M.D.   On: 08/12/2020 20:21   VAS [place] LOWER EXTREMITY VENOUS (DVT)  Result Date: 08/14/2020  Lower Venous DVT Study Other Indications: Embolic stroke. Comparison Study: No previous for comparison Performing Technologist: Vonzell Schlatter  Examination Guidelines: A complete evaluation includes B-mode imaging, spectral Doppler, color Doppler, and power Doppler as needed of all accessible portions of each vessel. Bilateral testing is considered an integral part of a complete examination. Limited examinations for reoccurring indications may be performed as noted. The reflux portion of the exam is performed with the patient in reverse Trendelenburg.  +---------+---------------+---------+-----------+----------+--------------+ RIGHT     CompressibilityPhasicitySpontaneityPropertiesThrombus Aging +---------+---------------+---------+-----------+----------+--------------+ CFV      Full           Yes      Yes                                 +---------+---------------+---------+-----------+----------+--------------+ SFJ      Full                                                        +---------+---------------+---------+-----------+----------+--------------+ FV Prox  Full                                                        +---------+---------------+---------+-----------+----------+--------------+ FV Mid   Full                                                        +---------+---------------+---------+-----------+----------+--------------+  FV DistalFull                                                        +---------+---------------+---------+-----------+----------+--------------+ PFV      Full                                                        +---------+---------------+---------+-----------+----------+--------------+ POP      Full           Yes      Yes                                 +---------+---------------+---------+-----------+----------+--------------+ PTV      Full                                                        +---------+---------------+---------+-----------+----------+--------------+ PERO     Full                                                        +---------+---------------+---------+-----------+----------+--------------+   +---------+---------------+---------+-----------+----------+--------------+ LEFT     CompressibilityPhasicitySpontaneityPropertiesThrombus Aging +---------+---------------+---------+-----------+----------+--------------+ CFV      Full           Yes      Yes                                 +---------+---------------+---------+-----------+----------+--------------+ SFJ      Full                                                         +---------+---------------+---------+-----------+----------+--------------+ FV Prox  Full                                                        +---------+---------------+---------+-----------+----------+--------------+ FV Mid   Full                                                        +---------+---------------+---------+-----------+----------+--------------+ FV DistalFull                                                        +---------+---------------+---------+-----------+----------+--------------+  PFV      Full           Yes      Yes                                 +---------+---------------+---------+-----------+----------+--------------+ POP      Full                                                        +---------+---------------+---------+-----------+----------+--------------+ PTV      Full                                                        +---------+---------------+---------+-----------+----------+--------------+ PERO     Full                                                        +---------+---------------+---------+-----------+----------+--------------+     Summary: RIGHT: - There is no evidence of deep vein thrombosis in the lower extremity.  - No cystic structure found in the popliteal fossa.  LEFT: - There is no evidence of deep vein thrombosis in the lower extremity.  - No cystic structure found in the popliteal fossa.  *See table(s) above for measurements and observations. Electronically signed by Harold Barban MD on 08/14/2020 at 5:34:33 PM.    Final    HISTORY OF PRESENT ILLNESS Anthony Buckley is a 34 y.o. male with a PMHx of congenital aortic stenosis and regurgitation status post aortic valve replacement x2 (at age 45 and then again in 2706) complicated by aortic dilation status post graft (2015 with valve replacement). He reports he was recommended to take aspirin for 6 weeks after his last surgery in 2015. Otherwise  he has not been on any medications. He is to follow with Dr. Festus Holts at [facility] clinic (last seen in 2016 due to lapse in insurance).   He was in his usual state of health today 08/12/2020 when at 7:15 PM he suddenly had difficulty with his left side, dropping his phone and feeling numb. He presented to [facility] long hospital and was evaluated by teleneurology.  On review of systems, he denies any other transient neurological symptoms but then his brother reports that he did have 1 transient episode of left hand numbness a few weeks ago lasting a few seconds (chalked up to being outside in the cold, under dressed for the weather). He additionally reports having lost 30 pounds since June 1, most of this over the early summer months and stable for the past 3 months, intentionally lost with lifestyle changes (diet and exercise). He additionally notes that he has had some difficult experiences with anesthesia (unstable heart rates for his first 2 surgeries, uncomplicated most recent procedure)   He was administered tPA at [facility] long hospital at 20:59. He was not an IA candidate. Premorbid modified rankin scale: 0 - No symptoms. He was transferred to [facility] for ongoing  stroke evaluation and treatment.   HOSPITAL COURSE Anthony Buckley is a 34 y.o. male with history of  congenital aortic stenosis and regurgitation status post aortic valve replacement x2 (at age 23 and then again in 7672) complicated by aortic dilation status post graft (2015 with valve replacement). He reports he was recommended to take aspirin for 6 weeks after his last surgery in 2015. Otherwise he has not been on any medications. He is to follow with Dr. Festus Holts at [facility] clinic (last seen in 2016 due to lapse in insurance)  presenting to [facility] ED with acute onset L sided weakness and evaluated by teleneurology and treated w/ tPA 08/12/2020 at 2059. Transferred to [facility] for post tPA care.     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 ASPECTS 10.    CTA head & neck proximal R M2 superior division occlusion w/ moderate collateralization.  CT perfusion 16mL R MCA core w/ 83mL penumbra MRI - Acute/early subacute cortical R insular infarct, extending slightly into the R frontoparietal operculum. Additional tiny scattered acute/early subacute cortical and subcortical infarcts within the R frontoparietal operculum and more posteriorly within the R parietal lobe MRA - Persistent apparent flow gap within a proximal M2 right MCA vessel, which may reflect segmental occlusion with distal reconstitution or severe segmental stenosis. 2D Echo EF 60 to 65%.  Severe LV hypertrophy, severe AV thickening LE venous Doppler no DVT TEE  EF 60-56%. No thrombus/mass. S/p AVR w/ bioprosthetic valve. 2 small fibrinous structures (near former [place] on ventricular side very small, aortic root/ascending aorta attached to leaflet but extends near annula/prior aortic graph largely linear fibrinous w/ small calcified portion). Neg bubble. No obvious embolus   Discussed loop recorder - will defer until insured, discussed w/ pt and mother and they agreed that would be best LDL 144 HgbA1c 4.6 Hypercoagulable labs B2GPi 26; most labs neg, some still pending  ANA negative ESR and CRP neg UDS neg No antithrombotic prior to admission, now on aspirin 325 mg daily and clopidogrel 75 mg daily. Continue DAPT x 3 weeks then aspirin alone    Therapy recommendations:  No PT or OT f/u Disposition:  Return home   Hyperlipidemia Home meds:  No statin listed LDL 144, goal < 70 Add Lipitor 40 Continue statin at discharge   Elevated TSH TSH 9.55 (H) Repeat TSH, free T4 in a.m. -> TSH 5.288 ; Free T4 0.88 Mildly elevated TSH but normal free  T4 unclear significance will need to repeat as an outpatient   Hx congenital aortic stenosis and regurgitation  status post aortic valve replacement x2 (at age 60 and then again in 0947) complicated by aortic dilation status post graft (2015 with valve replacement).  Only recommended to take aspirin for 6 weeks after his last surgery in 2015.  He is to follow with Dr. Festus Holts at [facility] clinic (last seen in 2016 due to lapse in insurance). Recent moved to [facility], yet to find cardiologist to follow-up follow up with cardiology - states he plans to [facility]    Other Stroke Risk Factors Occasional ETOH use Obesity, Body mass index is 33.68 kg/m., BMI >/= 30 associated with increased stroke risk, recommend weight loss, diet and exercise as appropriate   Other Active Problems Hypokalemia 3.4->3.3 -supplement -  3.8  DISCHARGE EXAM Blood pressure 128/87, pulse 64, temperature 97.6 F (36.4 C), temperature source Oral, resp. rate 12, height $RemoveBe'5\' 11"'RznJPeDht$  (1.803 m), weight 109.5 kg, SpO2 97 %. General - Well nourished, well developed young Caucasian male, in no apparent distress.   Ophthalmologic - fundi not visualized due to noncooperation.   Cardiovascular - Regular rhythm and rate.  Midline surgical scar from prior cardiac surgery   Mental Status -  Level of arousal and orientation to time, place, and person were intact. Language including expression, naming, repetition, comprehension was assessed and found intact. Attention span and concentration were normal. Fund of Knowledge was assessed and was intact.   Cranial Nerves II - XII - II - Visual field intact OU. III, IV, VI - Extraocular movements intact. V - Facial sensation intact bilaterally. VII - Facial movement intact bilaterally. VIII - Hearing & vestibular intact bilaterally. X - Palate elevates symmetrically. XI - Chin turning & shoulder shrug intact bilaterally. XII - Tongue protrusion intact.    Motor Strength - The patient's strength was normal in all extremities and pronator drift was absent.  Bulk was normal and fasciculations were absent.   Motor Tone - Muscle tone was assessed at the neck and appendages and was normal.  Diminished fine finger movements on the left.  Orbits right over left upper extremity.  Trace left grip weakness.   Reflexes - The patient's reflexes were symmetrical in all extremities and he had no pathological reflexes.   Sensory - Light touch, temperature/pinprick were assessed and were symmetrical, .   Coordination - The patient had normal movements in the hands and feet with no ataxia or dysmetria.  Tremor was absent.   Gait and Station - deferred.    Discharge Diet   Heart healthy diet, thin liquids  DISCHARGE PLAN Disposition:  Return home aspirin 81 mg daily and clopidogrel 75 mg daily for secondary stroke prevention for 3 weeks then ASPIRIN alone. Ongoing stroke risk factor control by Primary Care Physician at time of discharge Follow-up PCP in 2 weeks. Follow-up in [facility] in 4 weeks, office to schedule an appointment.  Follow-up cardiology - pt plans to follow up at the [facility] Consider loop implantation to look for atrial fibrillation as source of stroke once insured/financially able  35 minutes were spent preparing discharge.  Burnetta Sabin, MSN, APRN, ANVP-BC, AGPCNP-BC Advanced [facility] Stroke Nurse [place] for Schedule & Pager information 08/16/2020 3:33 PM   I have personally obtained history,examined this patient, reviewed notes, independently viewed imaging studies, participated in medical decision making and plan of care.ROS completed by me personally and pertinent positives fully documented  I have made any additions or clarifications directly to the above note. Agree with note above.    Antony Contras, MD Medical Director [facility] Pager:  [phone] 08/16/2020 4:16 PM

## 2020-08-16 NOTE — CV Procedure (Signed)
    TRANSESOPHAGEAL ECHOCARDIOGRAM   NAME:  Anthony Buckley   MRN: [id number] DOB:  1986-09-10   ADMIT DATE: 08/12/2020  INDICATIONS: CVA  PROCEDURE:   Informed consent was obtained prior to the procedure. The risks, benefits and alternatives for the procedure were discussed and the patient comprehended these risks.  Risks include, but are not limited to, cough, sore throat, vomiting, nausea, somnolence, esophageal and stomach trauma or perforation, bleeding, low blood pressure, aspiration, pneumonia, infection, trauma to the teeth and death.    Procedural time out performed.   Patient received monitored anesthesia care under the supervision of Dr. Krista Blue. Patient received a total of 295 mg propofol and 2 mg versed during the procedure.  The transesophageal probe was inserted in the esophagus and stomach without difficulty and multiple views were obtained.    COMPLICATIONS:    There were no immediate complications.  FINDINGS:  LEFT VENTRICLE: EF = 60-65%. No regional wall motion abnormalities.  RIGHT VENTRICLE: Normal size and function.   LEFT ATRIUM: No thrombus/mass.  LEFT ATRIAL APPENDAGE: No thrombus/mass.   RIGHT ATRIUM: No thrombus/mass.  AORTIC VALVE:  S/P AVR with bioprosthetic valve. Leaflet tips are mildly-moderately calcified. Mean gradient 14 mmHg. There are two small fibrinous structures, one near the former NCC on the ventricular side that is very small. The other appears to be in the aortic root/ascending aorta. It appears attached to a leaflet but extends near the annular/prior aortic graft. This appears largely linear and fibrinous with a small calcified portion. Trivial central regurgitation.  MITRAL VALVE:    Normal structure. Trivial regurgitation. No vegetation.  TRICUSPID VALVE: Normal structure. Trivial regurgitation. No vegetation.  PULMONIC VALVE: Grossly normal structure. No regurgitation. No apparent vegetation.  INTERATRIAL SEPTUM: No PFO or ASD  seen by color Doppler. Negative bubble study  PERICARDIUM: No effusion noted.  DESCENDING AORTA: No plaque seen   CONCLUSION:S/P prior AVR and aortic root graft. No definitive cardiac source of embolism. The AVR has two small filamentous structures associated with it as noted.   Jodelle Red, MD, PhD [facility]  [address], [address] [place], [place] [address] [phone]   2:38 PM

## 2020-08-16 NOTE — Anesthesia Postprocedure Evaluation (Addendum)
Anesthesia Post Note  Patient: Court Gracia  Procedure(s) Performed: TRANSESOPHAGEAL ECHOCARDIOGRAM (TEE) (N/A ) BUBBLE STUDY     Patient location during evaluation: Endoscopy Anesthesia Type: MAC Level of consciousness: awake and alert Pain management: pain level controlled Vital Signs Assessment: post-procedure vital signs reviewed and stable Respiratory status: spontaneous breathing and respiratory function stable Cardiovascular status: stable Postop Assessment: no apparent nausea or vomiting Anesthetic complications: no   No complications documented.  Last Vitals:  Vitals:   08/16/20 1456 08/16/20 1506  BP: (!) 117/59 128/87  Pulse: 63 64  Resp: 13 12  Temp:    SpO2: 94% 97%    Last Pain:  Vitals:   08/16/20 1506  TempSrc:   PainSc: 0-No pain                 Lexa Coronado DANIEL

## 2020-08-16 NOTE — TOC Transition Note (Signed)
Transition of Care [facility]) - CM/SW Discharge Note   Patient Details  Name: Dary Dilauro MRN: [id number] Date of Birth: [birth year]  Transition of Care [facility] CM/SW Contact:  Kermit Balo, RN Phone Number: 08/16/2020, 3:41 PM   Clinical Narrative:    Pt discharging home with self care. No f/u per PT/OT and no DME needs.  Pt can afford his d/c medications. Pt to be transported home by his mom. CM emailed financial counseling to see patient prior to d/c if able.    Final next level of care: Home/Self Care Barriers to Discharge: Inadequate or no insurance, Barriers Unresolved (comment)   Patient Goals and CMS Choice Patient states their goals for this hospitalization and ongoing recovery are:: Pt requesting consult with Financial counseling      Discharge Placement                       Discharge Plan and Services                                     Social Determinants of Health (SDOH) Interventions     Readmission Risk Interventions No flowsheet data found.

## 2020-08-16 NOTE — Progress Notes (Signed)
Pt has been discharged from the unit via walking with the RN. AVS documentation has been given and reviewed. Pt has all belongings sent with them. All IV and tele has been disconnected. Pt denies any pain and is sent in good spirits

## 2020-08-16 NOTE — Progress Notes (Signed)
  Echocardiogram Echocardiogram Transesophageal has been performed.  Gerda Diss 08/16/2020, 3:15 PM

## 2020-08-16 NOTE — Transfer of Care (Signed)
Immediate Anesthesia Transfer of Care Note  Patient: Anthony Buckley  Procedure(s) Performed: TRANSESOPHAGEAL ECHOCARDIOGRAM (TEE) (N/A )  Patient Location: Endoscopy Unit  Anesthesia Type:MAC  Level of Consciousness: awake, alert  and sedated  Airway & Oxygen Therapy: Patient connected to nasal cannula oxygen  Post-op Assessment: Post -op Vital signs reviewed and stable  Post vital signs: stable  Last Vitals:  Vitals Value Taken Time  BP    Temp    Pulse    Resp    SpO2      Last Pain:  Vitals:   08/16/20 1322  TempSrc: Oral  PainSc: 0-No pain         Complications: No complications documented.

## 2020-08-16 NOTE — Plan of Care (Signed)

## 2020-08-16 NOTE — Progress Notes (Signed)
STROKE TEAM PROGRESS NOTE   INTERVAL HISTORY His mother is at the bedside.  He is sitting up in bed.  He has no complaints.  He scheduled for TEE later today.  Patient has no health insurance and will not be able to afford loop recorder insertion.  Vitals:   08/15/20 2018 08/15/20 2342 08/16/20 0355 08/16/20 0751  BP: 126/89 125/84 112/73 106/72  Pulse: 80 80 (!) 56 (!) 56  Resp: $Remo'18 18 18   'bSnzc$ Temp: 97.8 F (36.6 C) 98.8 F (37.1 C) 97.8 F (36.6 C) 97.6 F (36.4 C)  TempSrc: Oral Oral Oral Oral  SpO2: 97% 97% 98% 95%  Weight:      Height:       CBC:  Recent Labs  Lab 08/12/20 2003 08/12/20 2003 08/12/20 2036 08/13/20 0425  WBC 5.9  --   --  7.0  NEUTROABS 4.0  --   --  4.0  HGB 14.9   < > 13.9 14.9  HCT 42.5   < > 41.0 41.2  MCV 88.5  --   --  85.8  PLT 167  --   --  169   < > = values in this interval not displayed.   Basic Metabolic Panel:  Recent Labs  Lab 08/13/20 0425 08/14/20 0106  NA 140 140  K 3.3* 3.8  CL 105 105  CO2 24 25  GLUCOSE 87 83  BUN 12 10  CREATININE 1.06 1.03  CALCIUM 9.2 9.6   Lipid Panel:  Recent Labs  Lab 08/13/20 0425  CHOL 189  TRIG 101  HDL 25*  CHOLHDL 7.6  VLDL 20  LDLCALC 144*   HgbA1c:  Recent Labs  Lab 08/13/20 0425  HGBA1C 4.6*   Urine Drug Screen:  Recent Labs  Lab 08/13/20 1342  LABOPIA NONE DETECTED  COCAINSCRNUR NONE DETECTED  LABBENZ NONE DETECTED  AMPHETMU NONE DETECTED  THCU NONE DETECTED  LABBARB NONE DETECTED    Alcohol Level  Recent Labs  Lab 08/12/20 2027  ETH <10    IMAGING past 24 hours VAS [place] TRANSCRANIAL DOPPLER W BUBBLES  Result Date: 08/15/2020  Transcranial Doppler with Bubble Indications: Stroke. History: Left sided parasthesia, left facial droop. Bicuspid aortic valve with aortic valve replacement (bovine) 2006, aortic valve replacement (bioprosthetic) 2015. Comparison Study: No prior study Performing Technologist: Maudry Mayhew MHA, RDMS, RVT, RDCS  Examination Guidelines: A  complete evaluation includes B-mode imaging, spectral Doppler, color Doppler, and power Doppler as needed of all accessible portions of each vessel. Bilateral testing is considered an integral part of a complete examination. Limited examinations for reoccurring indications may be performed as noted.  Summary: No HITS at rest or during Valsalva. Negative transcranial Doppler Bubble study with no evidence of right to left intracardiac communication.  A vascular evaluation was performed. The right middle cerebral artery was studied. An IV was inserted into the patient's left Forearm. Verbal informed consent was obtained.  Negative TCD Bubble study *See table(s) above for TCD measurements and observations.  Diagnosing physician: Antony Contras MD Electronically signed by Antony Contras MD on 08/15/2020 at 12:40:14 PM.    Final     PHYSICAL EXAM    Temp:  [97.6 F (36.4 C)-98.8 F (37.1 C)] 97.6 F (36.4 C) (12/06 0751) Pulse Rate:  [55-80] 56 (12/06 0751) Resp:  [16-18] 18 (12/06 0355) BP: (106-126)/(72-89) 106/72 (12/06 0751) SpO2:  [95 %-98 %] 95 % (12/06 0751)  General - Well nourished, well developed young Caucasian male, in no apparent  distress.  Ophthalmologic - fundi not visualized due to noncooperation.  Cardiovascular - Regular rhythm and rate.  Midline surgical scar from prior cardiac surgery  Mental Status -  Level of arousal and orientation to time, place, and person were intact. Language including expression, naming, repetition, comprehension was assessed and found intact. Attention span and concentration were normal. Fund of Knowledge was assessed and was intact.  Cranial Nerves II - XII - II - Visual field intact OU. III, IV, VI - Extraocular movements intact. V - Facial sensation intact bilaterally. VII - Facial movement intact bilaterally. VIII - Hearing & vestibular intact bilaterally. X - Palate elevates symmetrically. XI - Chin turning & shoulder shrug intact  bilaterally. XII - Tongue protrusion intact.  Motor Strength - The patient's strength was normal in all extremities and pronator drift was absent.  Bulk was normal and fasciculations were absent.   Motor Tone - Muscle tone was assessed at the neck and appendages and was normal.  Diminished fine finger movements on the left.  Orbits right over left upper extremity.  Trace left grip weakness.  Reflexes - The patient's reflexes were symmetrical in all extremities and he had no pathological reflexes.  Sensory - Light touch, temperature/pinprick were assessed and were symmetrical, .  Coordination - The patient had normal movements in the hands and feet with no ataxia or dysmetria.  Tremor was absent.  Gait and Station - deferred.   ASSESSMENT/PLAN [name] is a 34 y.o. male with history of  congenital aortic stenosis and regurgitation status post aortic valve replacement x2 (at age 43 and then again in 7322) complicated by aortic dilation status post graft (2015 with valve replacement). He reports he was recommended to take aspirin for 6 weeks after his last surgery in 2015. Otherwise he has not been on any medications. He is to follow with Dr. Festus Holts at [facility] clinic (last seen in 2016 due to lapse in insurance)  presenting to [facility] ED with acute onset L sided weakness and evaluated by teleneurology and treated w/ tPA 08/12/2020 at 2059. Transferred to [facility] for post tPA care.    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 ASPECTS 10.     CTA head & neck proximal R M2 superior division occlusion w/ moderate collateralization.   CT perfusion 39mL R MCA core w/ 16mL penumbra  MRI - Acute/early subacute cortical infarct within the right insula, extending slightly into the right frontoparietal  operculum. Additional tiny scattered acute/early subacute cortical and subcortical infarcts within the right frontoparietal operculum and more posteriorly within the right parietal lobe  MRA - Persistent apparent flow gap within a proximal M2 right MCA vessel, which may reflect segmental occlusion with distal reconstitution or severe segmental stenosis.  2D Echo EF 60 to 65%.  Severe LV hypertrophy, severe AV thickening  LE venous Doppler no DVT  TEE  pending   Consider loop recorder once insured, discussed w/ pt and mother  LDL 144  HgbA1c 4.6  Hypercoagulable labs pending-homocystine normal  ANA negative  ESR and CRP neg  UDS neg  VTE prophylaxis - SCDs   No antithrombotic prior to admission, now on aspirin 325 mg daily and clopidogrel 75 mg daily. Continue DAPT x 3 weeks then aspirin alone     Therapy recommendations:  No PT  or OT f/u  Disposition:  Return home  Hyperlipidemia  Home meds:  No statin listed  LDL 144, goal < 70  Add Lipitor 40  Continue statin at discharge  Elevated TSH  TSH 9.55 (H)  Repeat TSH, free T4 in a.m. -> TSH 5.288 ; Free T4 0.88  Hx congenital aortic stenosis and regurgitation   status post aortic valve replacement x2 (at age 23 and then again in 9826) complicated by aortic dilation status post graft (2015 with valve replacement).   Only recommended to take aspirin for 6 weeks after his last surgery in 2015.   He is to follow with Dr. Festus Holts at [facility] clinic (last seen in 2016 due to lapse in insurance).  Recent moved to [place], yet to find cardiologist to follow-up   Other Stroke Risk Factors  Occasional ETOH use  Obesity, Body mass index is 33.68 kg/m., BMI >/= 30 associated with increased stroke risk, recommend weight loss, diet and exercise as appropriate   Other Active Problems  Hypokalemia 3.4->3.3 -supplement - 3.8  Mildly elevated TSH but normal free T4 unclear significance will need to repeat as  an outpatient  Hospital day # 3 Plan TEE today and likely discharge home after that.  We will not do loop recorder insertion as he has no health insurance and will not be able to afford follow-up for that.  Aspirin and Plavix for 3 weeks followed by aspirin alone.  Follow-up with his cardiologist Dr. Laurann Montana in [place] clinic.  Follow-up with me in stroke clinic in 6 weeks.  Long discussion with patient and mother and answered questions Antony Contras, MD   To contact Stroke Continuity provider, please refer to [email or web address] After hours, contact General Neurology

## 2020-08-16 NOTE — Anesthesia Preprocedure Evaluation (Addendum)
Anesthesia Evaluation  Patient identified by MRN, date of birth, ID band Patient awake    Reviewed: Allergy & Precautions, NPO status , Patient's Chart, lab work & pertinent test results  History of Anesthesia Complications Negative for: history of anesthetic complications  Airway Mallampati: II  TM Distance: >3 FB Neck ROM: Full    Dental no notable dental hx. (+) Dental Advisory Given   Pulmonary neg pulmonary ROS,    Pulmonary exam normal        Cardiovascular Normal cardiovascular exam+ Valvular Problems/Murmurs   S/P AVR(x2)/Aortic root due to congenital Congenital AS  IMPRESSIONS   1. Left ventricular ejection fraction, by estimation, is 60 to 65%. The left ventricle has normal function. The left ventricle has no regional wall motion abnormalities. There is severe left ventricular hypertrophy. Left ventricular diastolic parameters are indeterminate. 2. Right ventricular systolic function is normal. The right ventricular size is normal. There is normal pulmonary artery systolic pressure. 3. Right atrial size was mildly dilated. 4. The mitral valve is normal in structure. No evidence of mitral valve regurgitation. 5. This aortic valve gradient is likely normla for this valve. No prior echo for comparison. . The aortic valve has been repaired/replaced. There is moderate calcification of the aortic valve. There is severe thickening of the aortic valve. Aortic valve regurgitation is trivial. 6. S/p aortic root replacement . Aortic root/ascending aorta has been repaired/replaced.    Neuro/Psych CVA    GI/Hepatic negative GI ROS, Neg liver ROS,   Endo/Other  negative endocrine ROS  Renal/GU negative Renal ROS     Musculoskeletal negative musculoskeletal ROS (+)   Abdominal   Peds  Hematology negative hematology ROS (+)   Anesthesia Other Findings   Reproductive/Obstetrics                             Anesthesia Physical Anesthesia Plan  ASA: III  Anesthesia Plan: MAC   Post-op Pain Management:    Induction: Intravenous  PONV Risk Score and Plan: 2 and Propofol infusion and TIVA  Airway Management Planned: Natural Airway  Additional Equipment:   Intra-op Plan:   Post-operative Plan:   Informed Consent: I have reviewed the patients History and Physical, chart, labs and discussed the procedure including the risks, benefits and alternatives for the proposed anesthesia with the patient or authorized representative who has indicated his/her understanding and acceptance.     Dental advisory given  Plan Discussed with: Anesthesiologist and CRNA  Anesthesia Plan Comments:        Anesthesia Quick Evaluation

## 2020-08-16 NOTE — Anesthesia Procedure Notes (Signed)
Procedure Name: MAC Date/Time: 08/16/2020 2:17 PM Performed by: Lavell Luster, CRNA Pre-anesthesia Checklist: Patient identified, Emergency Drugs available, Patient being monitored, Timeout performed and Suction available Patient Re-evaluated:Patient Re-evaluated prior to induction Oxygen Delivery Method: Nasal cannula Preoxygenation: Pre-oxygenation with 100% oxygen Induction Type: IV induction Placement Confirmation: breath sounds checked- equal and bilateral and positive ETCO2 Dental Injury: Teeth and Oropharynx as per pre-operative assessment

## 2020-08-16 NOTE — Progress Notes (Signed)
    [facility] has been requested to perform a transesophageal echocardiogram on Yaseen Gilberg for stroke.  After careful review of history and examination, the risks and benefits of transesophageal echocardiogram have been explained including risks of esophageal damage, perforation (1:10,000 risk), bleeding, pharyngeal hematoma as well as other potential complications associated with conscious sedation including aspiration, arrhythmia, respiratory failure and death. Alternatives to treatment were discussed, questions were answered. Patient is willing to proceed.   Pt scheduled today at 2pm. NPO please.  Roe Rutherford Samaad Hashem, [place]  08/16/2020 10:16 AM

## 2020-08-17 LAB — CARDIOLIPIN ANTIBODIES, IGG, IGM, IGA
Anticardiolipin IgA: <9 APL U/mL (ref 0–11)
Anticardiolipin IgG: <9 GPL U/mL (ref 0–14)
Anticardiolipin IgM: <9 MPL U/mL (ref 0–12)

## 2020-08-17 LAB — ECHO TEE
AV Mean grad: 14 mmHg
AV Peak grad: 23.6 mmHg
Ao pk vel: 2.43 m/s

## 2020-09-01 ENCOUNTER — Inpatient Hospital Stay (INDEPENDENT_AMBULATORY_CARE_PROVIDER_SITE_OTHER): Payer: Self-pay | Admitting: Primary Care

## 2020-09-21 ENCOUNTER — Other Ambulatory Visit: Payer: Self-pay

## 2020-09-21 ENCOUNTER — Encounter: Payer: Self-pay | Admitting: Adult Health

## 2020-09-21 ENCOUNTER — Ambulatory Visit: Payer: Self-pay | Admitting: Adult Health

## 2020-09-21 VITALS — BP 128/86 | HR 65 | Ht 71.0 in | Wt 240.0 lb

## 2020-09-21 DIAGNOSIS — I63511 Cerebral infarction due to unspecified occlusion or stenosis of right middle cerebral artery: Secondary | ICD-10-CM

## 2020-09-21 DIAGNOSIS — E785 Hyperlipidemia, unspecified: Secondary | ICD-10-CM

## 2020-09-21 DIAGNOSIS — Q230 Congenital stenosis of aortic valve: Secondary | ICD-10-CM

## 2020-09-21 MED ORDER — ATORVASTATIN CALCIUM 40 MG PO TABS: 40.0000 mg | ORAL_TABLET | Freq: Every day | ORAL | 3 refills | Status: AC

## 2020-09-21 NOTE — Patient Instructions (Addendum)
Continue aspirin 81 mg daily  and atorvastatin for secondary stroke prevention  Once you obtain insurance, we will plan on repeating thyroid levels and further look into pursing loop recorder  Ensure you re-establish care with [facility] - If you need further assistance establishing care with cardiology please let me know  Maintain strict control of hypertension with blood pressure goal below 130/90, diabetes with hemoglobin A1c goal below 6.5% and cholesterol with LDL cholesterol (bad cholesterol) goal below 70 mg/dL.      Followup in the future with me in 4 months or call earlier if needed     Thank you for coming to see [place] at [facility]. I hope we have been able to provide you high quality care today.  You may receive a patient satisfaction survey over the next few weeks. We would appreciate your feedback and comments so that we may continue to improve ourselves and the health of our patients.     Eating Plan After Stroke A stroke causes damage to the brain cells, which can affect your ability to walk, talk, and even eat. The impact of a stroke is different for everyone, and so is recovery. A good nutrition plan is important for your recovery. It can also lower your risk of another stroke. If you have difficulty chewing and swallowing your food, a dietitian or your stroke care team can help so that you can enjoy eating healthy foods. What are tips for following this plan? Reading food labels  Choose foods that have less than 300 milligrams (mg) of sodium per serving. Limit your sodium intake to less than 1,500 mg per day.  Avoid foods that have saturated fat and trans fat.  Choose foods that are low in cholesterol. Limit the amount of cholesterol you eat each day to less than 200 mg.  Choose foods that are high in fiber. Eat 20-30 grams (g) of fiber each day.  Avoid foods with added sugar. Check the food label for ingredients such as sugar, corn  syrup, honey, fructose, molasses, and cane juice. Shopping  At the grocery store, buy most of your food from areas near the walls of the store. This includes: ? Fresh fruits and vegetables. ? Dry grains, beans, nuts, and seeds. ? Fresh seafood, poultry, lean meats, and eggs. ? Low-fat dairy products.  Buy whole ingredients instead of prepackaged foods.  Buy fresh, in-season fruits and vegetables from local farmers markets.  Buy frozen fruits and vegetables in resealable bags. Cooking  Prepare foods with very little salt. Use herbs or salt-free spices instead.  Cook with heart-healthy oils, such as olive, avocado, canola, soybean, or sunflower oil.  Avoid frying foods. Bake, grill, or broil foods instead.  Remove visible fat and skin from meat and poultry before eating.  Modify food textures as told by your health care provider. Meal planning  Eat a wide variety of colorful fruits and vegetables. Make sure one-half of your plate is filled with fruits and vegetables at each meal.  Eat fruits and vegetables that are high in potassium, such as: ? Apples, bananas, oranges, and melon. ? Sweet potatoes, spinach, zucchini, and tomatoes.  Eat fish that contain heart-healthy fats (omega-3 fats) at least twice a week. These include salmon, tuna, mackerel, and sardines.  Eat plant foods that are high in omega-3 fats, such as flaxseeds and walnuts. Add these to cereals, yogurt, or pasta dishes.  Eat several servings of high-fiber foods each day, such as fruits, vegetables, whole grains,  and beans.  Do not put salt at the table for meals.  When eating out at restaurants: ? Ask the server about low-salt or salt-free food options. ? Avoid fried foods. Look for menu items that are grilled, steamed, broiled, or roasted. ? Ask if your food can be prepared without butter. ? Ask for condiments, such as salad dressings, gravy, or sauces to be served on the side.  If you have difficulty  swallowing: ? Choose foods that are softer and easier to chew and swallow. ? Cut foods into small pieces and chew well before swallowing. ? Thicken liquids as told by your health care provider or dietitian. ? Let your health care provider know if your condition does not improve over time. You may need to work with a speech therapist to re-train the muscles that are used for eating. General recommendations  Involve your family and friends in your recovery, if possible. It may be helpful to have a slower meal time and to plan meals that include foods everyone in the family can eat.  Brush your teeth with fluoride toothpaste twice a day, and floss once a day. Keeping a clean mouth can help you swallow and can also help your appetite.  Drink enough water each day to keep your urine pale yellow. If needed, set reminders or ask your family to help you remember to drink water.  Limit alcohol intake to no more than 1 drink a day for nonpregnant women and 2 drinks a day for men. One drink equals 12 oz of beer, 5 oz of wine, or 1 oz of hard liquor.   Summary  Following this eating plan can help in your stroke recovery and can decrease your risk for another stroke.  Let your health care provider know if you have problems with swallowing. You may need to work with a speech therapist. This information is not intended to replace advice given to you by your health care provider. Make sure you discuss any questions you have with your health care provider. Document Revised: 12/19/2018 Document Reviewed: 11/05/2017 Elsevier Patient Education  2021 [employer].

## 2020-09-21 NOTE — Progress Notes (Signed)
I agree with the above plan 

## 2020-09-21 NOTE — Progress Notes (Signed)
[facility] [address] [place]. [place] [address] [phone]       HOSPITAL FOLLOW UP NOTE  [name] Date of Birth:  07/31/1986 Medical Record Number:  [id number]   Reason for Referral:  hospital stroke follow up    SUBJECTIVE:   CHIEF COMPLAINT:  Chief Complaint  Patient presents with   Follow-up    Treatment Room alone PT is well doing good    HPI:   Mr.Anthony Buckley a 35 y.o.malewith history of congenital aortic stenosis and regurgitation status post aortic valve replacement x2 (at age 80 and then again in 0932) complicated by aortic dilation status post graft (2015 with valve replacement).He reports he was recommended to take aspirin for 6 weeks after his last surgery in 2015. Otherwise he has not been on any medications. He is to follow with Dr. Festus Holts at [facility] clinic (last seen in 2016 due to lapse in insurance) who presented 08/12/2020 to [facility] ED with acute onset L sided weakness and evaluated by teleneurology andtreated w/ tPA 08/12/2020 at 2059 and transferred to [facility] ED.  Personally reviewed hospitalization pertinent progress notes, lab work and imaging with summary provided.  Stroke work-up revealed right MCA infarct s/p tPA in setting of right M2 occlusion, embolic secondary to unknown source with concern of occult A. fib given previous cardiac surgeries.  2D echo normal EF with severe LV hypertrophy and severe AV thickening.  LE venous Doppler negative for DVT.  TEE no evidence of thrombus/mass and negative bubble.  Recommended loop recorder placement but deferred until insured.  Hypercoagulable labs B2GPi 26 with some pending at discharge.  Recommended DAPT for 3 weeks and aspirin alone.  LDL 144 and initiate atorvastatin 40 mg daily.  Elevated TSH but normal free T4 and recommended repeat as outpatient.  History of congenital aortic stenosis and regurgitation s/p aortic valve replacement x2 complicated by  aortic dilation s/p graft with no cardiology follow-up since 2016 due to lapse of insurance with reported patient planned on returning to [facility] clinic to follow-up with cardiology.  No prior history of HTN or DM.  Other stroke risk factors include EtOH use and obesity but no prior stroke history.  He was evaluated by therapies and discharged back home without therapy needs.  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 ASPECTS 10.   CTA head &neck proximal R M2 superior division occlusion w/ moderate collateralization.   CT perfusion 74mL R MCA core w/ 31mL penumbra  MRI- Acute/early subacute cortical R insular infarct, extending slightly into the R frontoparietal operculum.Additional tiny scattered acute/early subacute cortical andsubcortical infarcts within the R frontoparietal operculum andmore posteriorly within the R parietal lobe  MRA- Persistent apparent flow gap within a proximal M2 right MCA vessel,which may reflect segmental occlusion with distal reconstitution orsevere segmental stenosis.  2D EchoEF 60 to 65%. Severe LV hypertrophy, severe AV thickening  LE venous Doppler no DVT  TEEEF 60-56%. No thrombus/mass. S/p AVR w/ bioprosthetic valve. 2 small fibrinous structures (near former [place] on ventricular side very small, aortic root/ascending aorta attached to leaflet but extends near annula/prior aortic graph largely linear fibrinous w/ small calcified portion). Neg bubble. No obvious embolus    Discussed loop recorder- will defer until insured, discussed w/ pt and mother and they agreed that would be best  LDL144  HgbA1c4.6  Hypercoagulablelabs neg  ANAnegative  ESR and CRP neg  UDS neg  No antithromboticprior to admission, now on aspirin 325 mg daily and clopidogrel 75 mg daily. Continue DAPT x  3 weeks then aspirin alone  Therapy recommendations: No PT or OT f/u  Disposition:Return home  Today, 09/21/2020, [name] is being seen for hospital follow-up unaccompanied.  He has been doing well since discharge without residual deficits and denies new or recurrent stroke/TIA symptoms.  Completed 3 weeks DAPT and remains on aspirin alone without bleeding or bruising.  Remains on atorvastatin 40 mg daily without myalgias.  Blood pressure today 128/86.  He is currently in the process of reestablishing care with [facility] clinic cardiology.  He is also currently looking into applying for Medicaid to obtain insurance.  No concerns at this time.     ROS:   14 system review of systems performed and negative with exception of no complaints  PMH: History reviewed. No pertinent past medical history.  PSH:  Past Surgical History:  Procedure Laterality Date   BUBBLE STUDY  08/16/2020   Procedure: BUBBLE STUDY;  Surgeon: Jodelle Red, MD;  Location: [facility] ENDOSCOPY;  Service: Cardiovascular;;   TEE WITHOUT CARDIOVERSION N/A 08/16/2020   Procedure: TRANSESOPHAGEAL ECHOCARDIOGRAM (TEE);  Surgeon: Jodelle Red, MD;  Location: [facility] ENDOSCOPY;  Service: Cardiovascular;  Laterality: N/A;    Social History:  Social History   Socioeconomic History   Marital status: Single    Spouse name: Not on file   Number of children: Not on file   Years of education: Not on file   Highest education level: Not on file  Occupational History   Not on file  Tobacco Use   Smoking status: Never Smoker   Smokeless tobacco: Never Used  Vaping Use   Vaping Use: Never used  Substance and Sexual Activity   Alcohol use: Not on file   Drug use: Not on file   Sexual activity: Not on file  Other Topics Concern   Not on file  Social History Narrative   Not on file   Social Determinants of Health   Financial Resource Strain: Not on file  Food Insecurity: Not on file   Transportation Needs: Not on file  Physical Activity: Not on file  Stress: Not on file  Social Connections: Not on file  Intimate Partner Violence: Not on file    Family History: History reviewed. No pertinent family history.  Medications:   Current Outpatient Medications on File Prior to Visit  Medication Sig Dispense Refill   aspirin EC 81 MG EC tablet Take 1 tablet (81 mg total) by mouth daily. Swallow whole. 30 tablet 11   atorvastatin (LIPITOR) 40 MG tablet Take 1 tablet (40 mg total) by mouth daily. 30 tablet 2   No current facility-administered medications on file prior to visit.    Allergies:   Allergies  Allergen Reactions   Amoxicillin Rash      OBJECTIVE:  Physical Exam  Vitals:   09/21/20 1002  BP: 128/86  Pulse: 65  Weight: 240 lb (108.9 kg)  Height: 5\' 11"  (1.803 m)   Body mass index is 33.47 kg/m. No exam data present  General: well developed, well nourished,  pleasant middle-age Caucasian male, seated, in no evident distress Head: head normocephalic and atraumatic.   Neck: supple with no carotid or supraclavicular bruits Cardiovascular: regular rate and rhythm, no murmurs Musculoskeletal: no deformity Skin:  no rash/petichiae Vascular:  Normal pulses all extremities   Neurologic Exam Mental  Status: Awake and fully alert.   Fluent speech and language.  Oriented to place and time. Recent and remote memory intact. Attention span, concentration and fund of knowledge appropriate. Mood and affect appropriate.  Cranial Nerves: Fundoscopic exam reveals sharp disc margins. Pupils equal, briskly reactive to light. Extraocular movements full without nystagmus. Visual fields full to confrontation. Hearing intact. Facial sensation intact. Face, tongue, palate moves normally and symmetrically.  Motor: Normal bulk and tone. Normal strength in all tested extremity muscles Sensory.: intact to touch , pinprick , position and vibratory sensation.  Coordination:  Rapid alternating movements normal in all extremities. Finger-to-nose and heel-to-shin performed accurately bilaterally. Gait and Station: Arises from chair without difficulty. Stance is normal. Gait demonstrates normal stride length and balance without use of assistive device Reflexes: 1+ and symmetric. Toes downgoing.     NIHSS  0 Modified Rankin  0      ASSESSMENT: Anthony Buckley is a 35 y.o. year old male presented with acute onset left-sided weakness on 08/12/2020 with stroke work-up revealing right MCA infarcts s/p tPA in setting of right M2 occlusion, embolic secondary to unknown source although concerning for occult A. fib given previous cardiac surgeries. Vascular risk factors include hx of congenital aortic stenosis and regurgitation s/p aortic valve replacement x2 complicated by aortic dilation s/p graft 2015, HLD, occasional EtOH use and obesity.      PLAN:  1. R MCA stroke, cryptogenic:  a. Residual deficit: Recovered well without residual deficit.  b. Continue aspirin 81 mg daily  and atorvastatin 40 mg daily for secondary stroke prevention.   c. Loop recorder deferred until insured -currently looking into Medicaid.  Discussed possible [facility] financial assistance program if denied for Medicaid.   d. Hypercoagulable labs negative.   e. Discussed secondary stroke prevention measures and importance of establishing care with PCP for routine follow up for aggressive stroke risk factor management  2. HLD: LDL goal <70. Recent LDL 144 and initiate atorvastatin 40 mg daily during recent admission.  Refill provided and will plan on repeat lipid panel at follow-up visit 3. Hx of aortic stenosis and regurgitation: s/p valve replacement.  Currently in the process of reestablishing care with cardiology at [facility] clinic 4. Elevated TSH: Plan on repeating thyroid function panel at follow-up visit as he is currently uninsured    Follow up in 4 months or call earlier if needed   CC:   [facility] provider: Dr. Leonie Man   I spent 45 minutes of face-to-face and non-face-to-face time with patient.  This included previsit chart review including recent hospitalization pertinent progress notes, lab work and imaging, lab review, study review, order entry, electronic health record documentation, patient education regarding recent stroke and possible etiologies, importance of managing stroke risk factors and answered all questions to patient satisfaction   Frann Rider, [facility]  [facility] [address] [place], [place] [address]  Phone [phone] Fax [phone] Note: This document was prepared with digital dictation and possible smart phrase technology. Any transcriptional errors that result from this process are unintentional.

## 2020-11-03 ENCOUNTER — Encounter: Payer: Self-pay | Admitting: Adult Health

## 2020-11-03 DIAGNOSIS — I63511 Cerebral infarction due to unspecified occlusion or stenosis of right middle cerebral artery: Secondary | ICD-10-CM

## 2020-11-03 NOTE — Telephone Encounter (Signed)
It appears as though he wants this completed at [facility] as he has longstanding history with them.  Typically, orders do not necessarily need to be obtained unless they need referral to cardiology (such as if they were not already established).  This would be something placed by his cardiology team who would be responsible for routine monitoring and potential need of management in the future.  I am not sure if his current cardiologist told him that he needs referral but typically this is not the case.

## 2020-11-03 NOTE — Telephone Encounter (Signed)
Called pt. he has not seen Dr. Valentina Lucks at [facility] since 2016.  He is going to be re-established Jan 12 2021.  He spoke to their office they require a referral to to them at fax #[phone] for the loop recorder.  He also stated would like to be notified once the referral has been placed and sent.  I started the order.  He has not seen anyone locally and he called and was told to have neuro send referral.

## 2020-11-04 NOTE — Telephone Encounter (Signed)
Order signed as requested.  Thank you for your assistance.

## 2020-11-16 ENCOUNTER — Other Ambulatory Visit: Payer: Self-pay

## 2020-11-16 NOTE — Patient Outreach (Signed)
[facility]) Care Management  11/16/2020  Anthony Buckley November 10, 1985 [id number]   First telephone outreach attempt to obtain mRS. No answer. Left message for returned call.  Vanice Sarah [facility] Management Assistant [phone]

## 2020-11-23 ENCOUNTER — Other Ambulatory Visit: Payer: Self-pay

## 2020-11-23 NOTE — Patient Outreach (Signed)
[facility]) Care Management  11/23/2020  Anthony Buckley 1986-05-20 [id number]  Second telephone outreach attempt to obtain mRS. No answer. Left message for returned call.  Vanice Sarah [facility] Management Assistant [phone]

## 2020-11-26 ENCOUNTER — Other Ambulatory Visit: Payer: Self-pay

## 2020-11-26 NOTE — Patient Outreach (Signed)
[facility]) Care Management  11/26/2020  Anthony Buckley 1985/09/18 [id number]   3 outreach attempts were completed to obtain mRs. mRs could not be obtained because patient never returned my calls. mRs=7    Vanice Sarah Care Management Assistant [phone]

## 2020-12-14 ENCOUNTER — Encounter: Payer: Self-pay | Admitting: Adult Health

## 2021-01-20 ENCOUNTER — Ambulatory Visit: Payer: Self-pay | Admitting: Adult Health

## 2021-02-21 ENCOUNTER — Ambulatory Visit: Payer: Self-pay | Admitting: Adult Health

## 2021-03-22 ENCOUNTER — Ambulatory Visit: Payer: Self-pay | Admitting: Adult Health

## 2021-05-01 IMAGING — MR MR MRA HEAD W/O CM
1 series · 23 of 48 positions shown · non-contrast
Comparison: Noncontrast head CT, CT angiogram head/neck and CT
perfusion 08/12/2020.

CLINICAL DATA: Stroke, follow-up.

EXAM:
MRI HEAD WITHOUT CONTRAST
MRA HEAD WITHOUT CONTRAST
TECHNIQUE: Multiplanar, multiecho pulse sequences of the brain and surrounding
structures were obtained without intravenous contrast. Angiographic
images of the head were obtained using MRA technique without
contrast.

[Series 5: 3d cow · axial · 0.5mm · 0.41mm/px · z∈[-94,-13]mm · 23 of 172 slices shown]
[im 1/172]
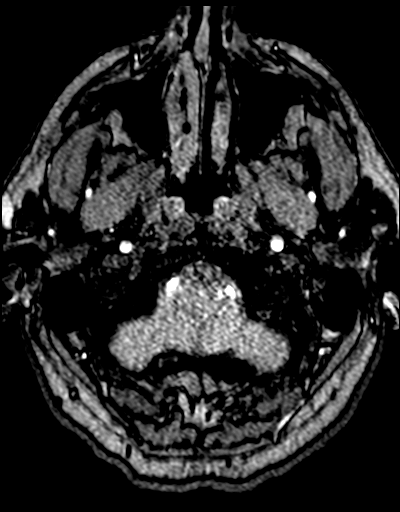
[im 4/172]
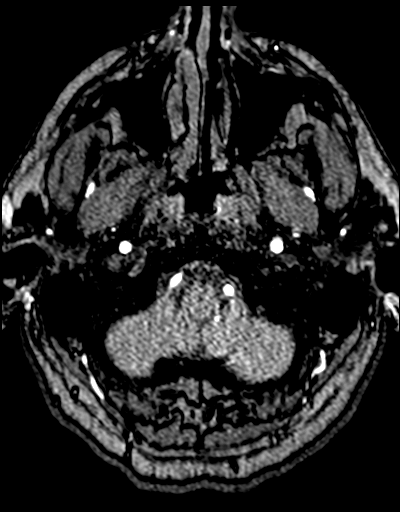
[im 8/172]
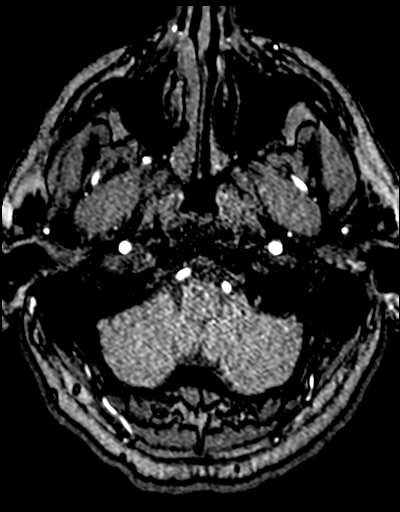
[im 11/172]
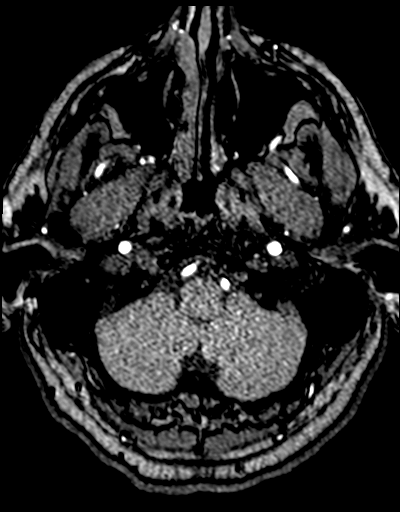
[im 15/172]
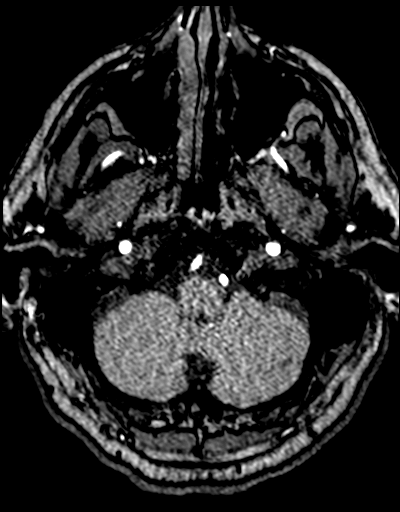
[im 19/172]
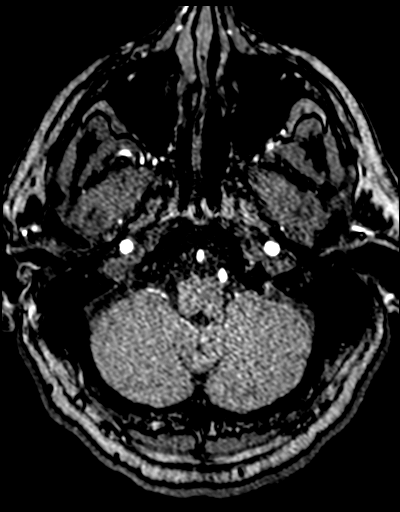
[im 22/172]
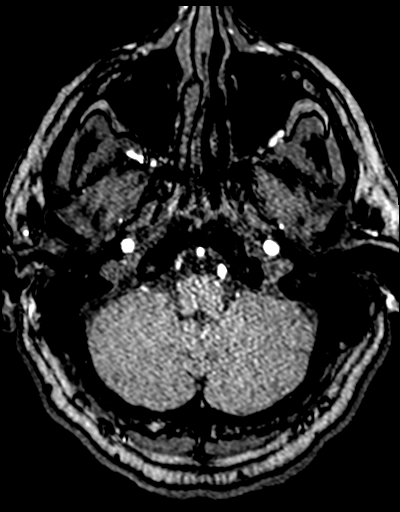
[im 26/172]
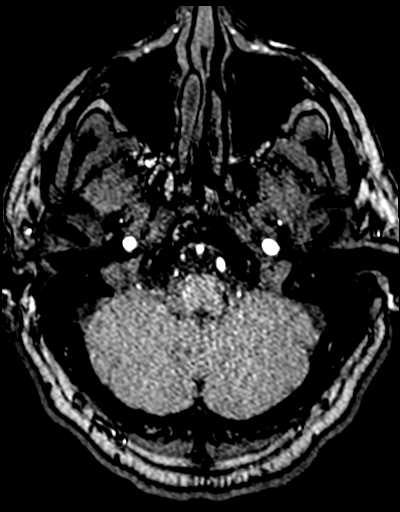
[im 30/172]
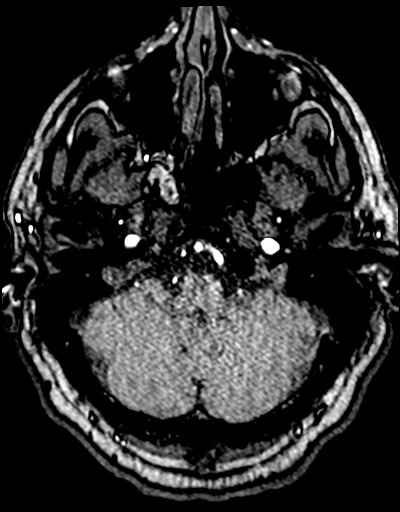
[im 33/172]
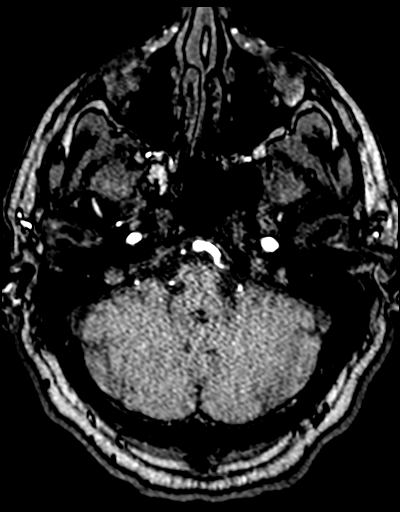
[im 37/172]
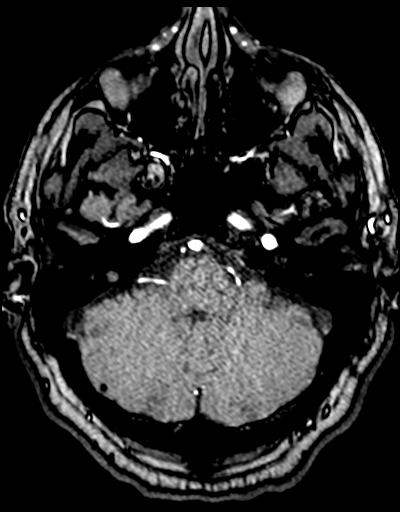
[im 41/172]
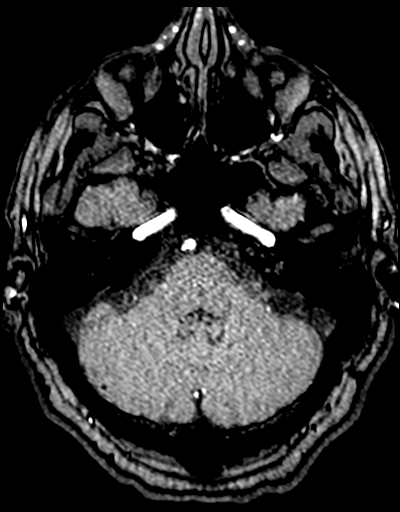
[im 44/172]
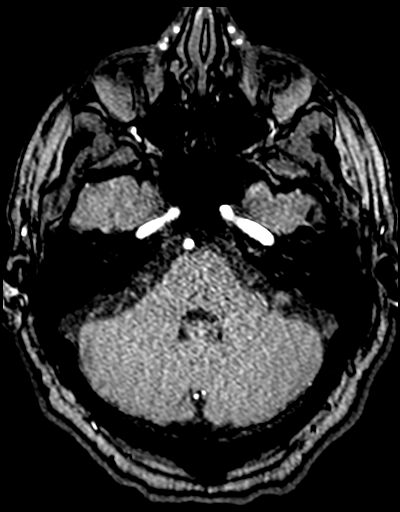
[im 48/172]
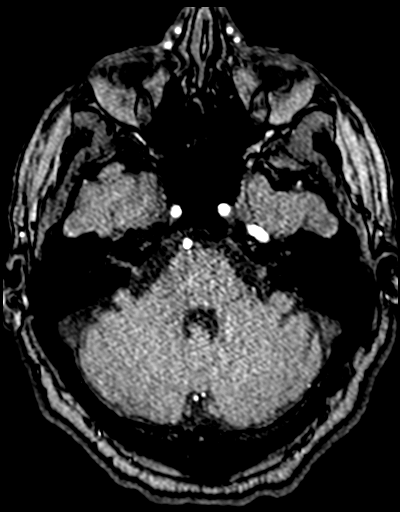
[im 51/172]
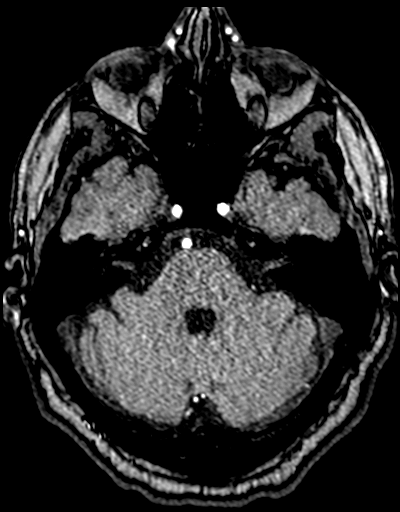
[im 55/172]
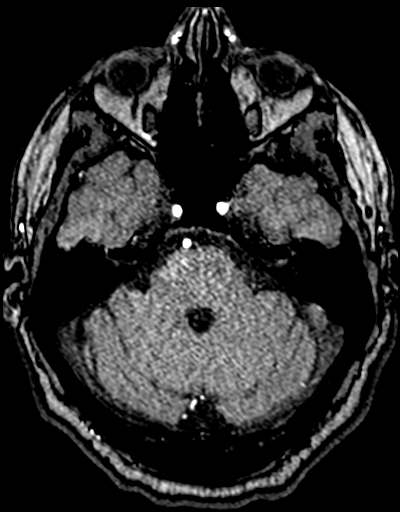
[im 77/172]
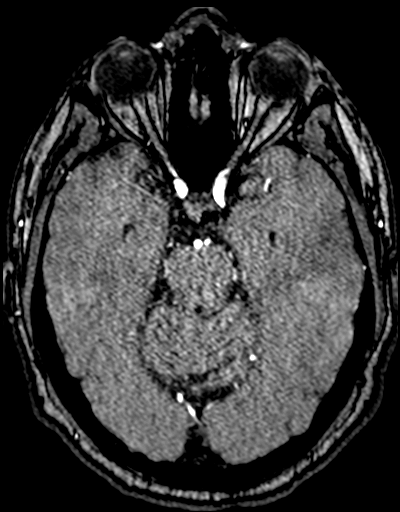
[im 88/172]
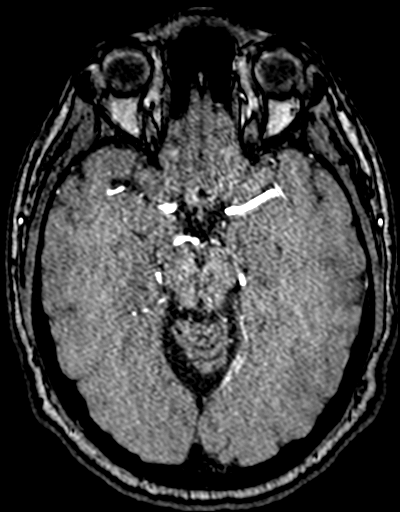
[im 99/172]
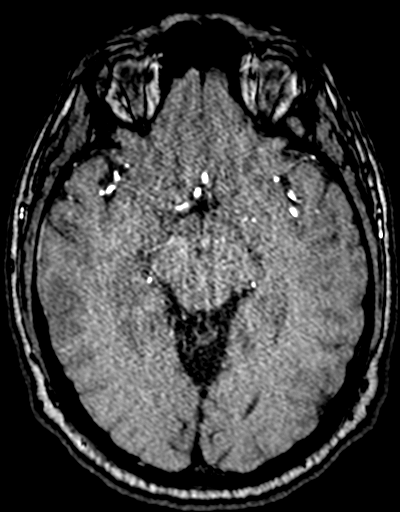
[im 121/172]
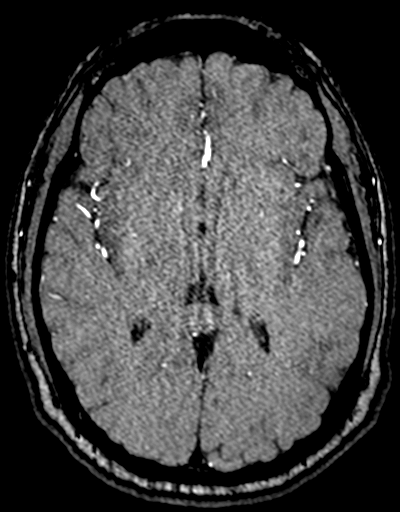
[im 142/172]
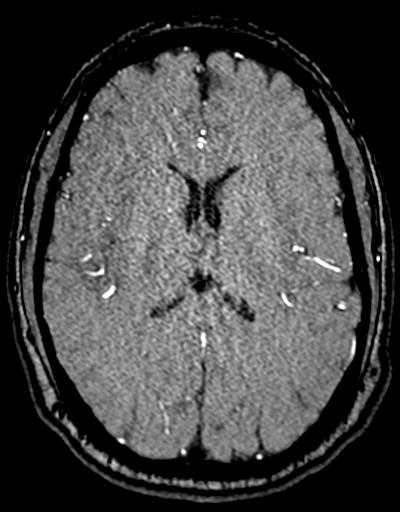
[im 146/172]
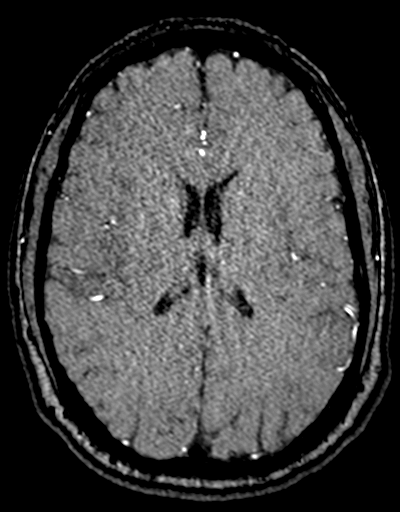
[im 164/172]
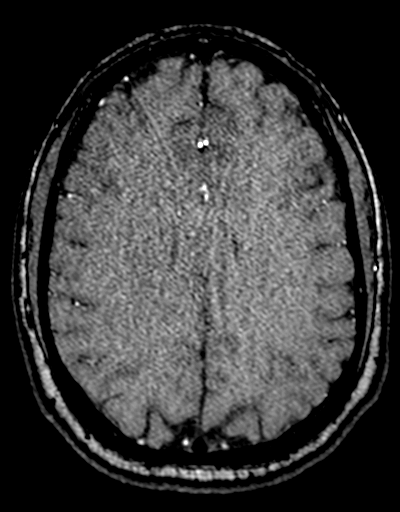

[23 of 48 positions shown; findings below may reference images not displayed]

FINDINGS: MRI HEAD FINDINGS

Brain:

Cerebral volume is normal.

Restricted diffusion within the mid and posterior right insula,
extending slightly into the adjacent right frontoparietal operculum.
Findings are compatible with acute/early subacute infarction.
Additional scattered tiny acute/early subacute cortical/subcortical
infarcts within the right frontoparietal operculum and more
posteriorly within the right parietal lobe. Corresponding T2/FLAIR
hyperintensity at these sites.

There are a few scattered supratentorial chronic microhemorrhages,
nonspecific.

No evidence of intracranial mass.

No extra-axial fluid collection.

No midline shift.

Vascular: Reported below.

Skull and upper cervical spine: No focal marrow lesion

Sinuses/Orbits: Visualized orbits show no acute finding. Trace
ethmoid sinus mucosal thickening. Small left maxillary sinus mucous
retention cysts

MRA HEAD FINDINGS

The intracranial internal carotid arteries are patent. The M1 middle
cerebral arteries are patent. Persistent apparent flow gap within a
proximal M2 right MCA vessel which may reflect segmental occlusion
with distal reconstitution or severe segmental stenosis (series
9055, image 16). Robust flow related signal more distally within
this vessel. No M2 proximal branch occlusion or high-grade proximal
stenosis identified elsewhere. The anterior cerebral arteries are
patent.

The intracranial vertebral arteries are patent. The basilar artery
is patent. The posterior cerebral arteries are patent.

No intracranial aneurysm is identified.
IMPRESSION: MRI brain:

1. Acute/early subacute cortical infarct within the right insula,
extending slightly into the right frontoparietal operculum.
Additional tiny scattered acute/early subacute cortical and
subcortical infarcts within the right frontoparietal operculum and
more posteriorly within the right parietal lobe. No mass effect. No
evidence of hemorrhagic conversion.
2. A few scattered supratentorial chronic microhemorrhages are
nonspecific.

MRA head:

Persistent apparent flow gap within a proximal M2 right MCA vessel,
which may reflect segmental occlusion with distal reconstitution or
severe segmental stenosis.

## 2021-11-15 ENCOUNTER — Telehealth: Payer: Self-pay | Admitting: Neurology

## 2021-11-15 NOTE — Telephone Encounter (Signed)
Pt's mother is asking for a call from Dr Pearlean Brownie re:the stroke that pt had in December of '21.  Pt's mother has spoken with [facility] Financial Aid she was advised to send a letter asking for coverage of the bill from the hospital for his stroke. Pt's mother is asking for a call ?

## 2021-11-15 NOTE — Telephone Encounter (Signed)
I spoke to Tobi Bastos in the billing department [phone]). Due to the dates of the bills, he is ineligible to re-apply for financial assistance with [facility].  Another option would have been for him to have applied for emergency [place] Medicaid at the time of or shortly after the medical crisis. Unfortunately, he will not be consider for that now since the invoices are over one year old.  ? ?She is unable to do anything from her end but Dr. Pearlean Brownie is certainly welcome to send a letter. ? ?It should be addressed to Josiah Lobo in [facility] patient accounting. It can be faxed or emailed to her attention. ? ?Email: [email or web address] ? ?Fax: [phone] ?

## 2021-11-16 ENCOUNTER — Encounter: Payer: Self-pay | Admitting: *Deleted

## 2021-11-16 NOTE — Telephone Encounter (Signed)
Letter has been written, signed by Dr. Leonie Man and faxed to Ahmed Prima. Also, called the patient's mother to let her know this has been done. She was very Patent attorney.  ?
